# Patient Record
Sex: Female | Born: 1995 | Race: Black or African American | Hispanic: No | Marital: Single | Smoking: Current some day smoker
Health system: Southern US, Community
[De-identification: ages and names within clinical notes are randomized; demographics above are authoritative.]

---
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
## 2015-11-07 ENCOUNTER — Encounter (HOSPITAL_COMMUNITY): Payer: Self-pay | Admitting: *Deleted

## 2015-11-07 ENCOUNTER — Emergency Department (HOSPITAL_COMMUNITY)
Admission: EM | Admit: 2015-11-07 | Discharge: 2015-11-07 | Disposition: A | Discharge status: Home / Self Care | Payer: Medicaid Other | Attending: Emergency Medicine | Admitting: Emergency Medicine

## 2015-11-07 DIAGNOSIS — B9789 Other viral agents as the cause of diseases classified elsewhere: Secondary | ICD-10-CM

## 2015-11-07 DIAGNOSIS — F17210 Nicotine dependence, cigarettes, uncomplicated: Secondary | ICD-10-CM | POA: Insufficient documentation

## 2015-11-07 DIAGNOSIS — R630 Anorexia: Secondary | ICD-10-CM | POA: Insufficient documentation

## 2015-11-07 DIAGNOSIS — J069 Acute upper respiratory infection, unspecified: Secondary | ICD-10-CM | POA: Insufficient documentation

## 2015-11-07 MED ORDER — ACETAMINOPHEN 500 MG PO TABS
1000.0000 mg | ORAL_TABLET | Freq: Once | ORAL | Status: AC
  Administered 2015-11-07: 1000 mg via ORAL
  Filled 2015-11-07: qty 2

## 2015-11-07 MED ORDER — ALBUTEROL SULFATE HFA 108 (90 BASE) MCG/ACT IN AERS
2.0000 | INHALATION_SPRAY | Freq: Once | RESPIRATORY_TRACT | Status: AC
  Administered 2015-11-07: 2 via RESPIRATORY_TRACT
  Filled 2015-11-07: qty 6.7

## 2015-11-07 NOTE — Discharge Instructions (Signed)
Upper Respiratory Infection, Adult Most upper respiratory infections (URIs) are a viral infection of the air passages leading to the lungs. A URI affects the nose, throat, and upper air passages. The most common type of URI is nasopharyngitis and is typically referred to as "the common cold." URIs run their course and usually go away on their own. Most of the time, a URI does not require medical attention, but sometimes a bacterial infection in the upper airways can follow a viral infection. This is called a secondary infection. Sinus and middle ear infections are common types of secondary upper respiratory infections. Bacterial pneumonia can also complicate a URI. A URI can worsen asthma and chronic obstructive pulmonary disease (COPD). Sometimes, these complications can require emergency medical care and may be life threatening.  CAUSES Almost all URIs are caused by viruses. A virus is a type of germ and can spread from one person to another.  RISKS FACTORS You may be at risk for a URI if:   You smoke.   You have chronic heart or lung disease.  You have a weakened defense (immune) system.   You are very young or very old.   You have nasal allergies or asthma.  You work in crowded or poorly ventilated areas.  You work in health care facilities or schools. SIGNS AND SYMPTOMS  Symptoms typically develop 2-3 days after you come in contact with a cold virus. Most viral URIs last 7-10 days. However, viral URIs from the influenza virus (flu virus) can last 14-18 days and are typically more severe. Symptoms may include:   Runny or stuffy (congested) nose.   Sneezing.   Cough.   Sore throat.   Headache.   Fatigue.   Fever.   Loss of appetite.   Pain in your forehead, behind your eyes, and over your cheekbones (sinus pain).  Muscle aches.  DIAGNOSIS  Your health care provider may diagnose a URI by:  Physical exam.  Tests to check that your symptoms are not due to  another condition such as:  Strep throat.  Sinusitis.  Pneumonia.  Asthma. TREATMENT  A URI goes away on its own with time. It cannot be cured with medicines, but medicines may be prescribed or recommended to relieve symptoms. Medicines may help:  Reduce your fever.  Reduce your cough.  Relieve nasal congestion. HOME CARE INSTRUCTIONS   Take medicines only as directed by your health care provider.   Gargle warm saltwater or take cough drops to comfort your throat as directed by your health care provider.  Use a warm mist humidifier or inhale steam from a shower to increase air moisture. This may make it easier to breathe.  Drink enough fluid to keep your urine clear or pale yellow.   Eat soups and other clear broths and maintain good nutrition.   Rest as needed.   Return to work when your temperature has returned to normal or as your health care provider advises. You may need to stay home longer to avoid infecting others. You can also use a face mask and careful hand washing to prevent spread of the virus.  Increase the usage of your inhaler if you have asthma.   Do not use any tobacco products, including cigarettes, chewing tobacco, or electronic cigarettes. If you need help quitting, ask your health care provider. PREVENTION  The best way to protect yourself from getting a cold is to practice good hygiene.   Avoid oral or hand contact with people with cold   symptoms.   Wash your hands often if contact occurs.  There is no clear evidence that vitamin C, vitamin E, echinacea, or exercise reduces the chance of developing a cold. However, it is always recommended to get plenty of rest, exercise, and practice good nutrition.  SEEK MEDICAL CARE IF:   You are getting worse rather than better.   Your symptoms are not controlled by medicine.   You have chills.  You have worsening shortness of breath.  You have brown or red mucus.  You have yellow or brown nasal  discharge.  You have pain in your face, especially when you bend forward.  You have a fever.  You have swollen neck glands.  You have pain while swallowing.  You have white areas in the back of your throat. SEEK IMMEDIATE MEDICAL CARE IF:   You have severe or persistent:  Headache.  Ear pain.  Sinus pain.  Chest pain.  You have chronic lung disease and any of the following:  Wheezing.  Prolonged cough.  Coughing up blood.  A change in your usual mucus.  You have a stiff neck.  You have changes in your:  Vision.  Hearing.  Thinking.  Mood. MAKE SURE YOU:   Understand these instructions.  Will watch your condition.  Will get help right away if you are not doing well or get worse.   This information is not intended to replace advice given to you by your health care provider. Make sure you discuss any questions you have with your health care provider.   Document Released: 01/23/2001 Document Revised: 12/14/2014 Document Reviewed: 11/04/2013 Elsevier Interactive Patient Education 2016 [employer]  

## 2015-11-07 NOTE — ED Notes (Signed)
Pt reports having cough, headache, bodyaches for several days. Had episodes of diarrhea, no vomiting. Mask on pt at triage.

## 2015-11-07 NOTE — ED Provider Notes (Signed)
CSN: [id number]     Arrival date & time 11/07/15  0857 History   First MD Initiated Contact with Patient 11/07/15 0912     Chief Complaint  Patient presents with  . Influenza     (Consider location/radiation/quality/duration/timing/severity/associated sxs/prior Treatment) Patient is a 20 y.o. female presenting with flu symptoms. The history is provided by the patient.  Influenza Presenting symptoms: cough and myalgias   Presenting symptoms: no vomiting   Severity:  Moderate Onset quality:  Gradual Duration:  4 days Progression:  Worsening Chronicity:  New Relieved by:  Nothing Worsened by:  Nothing tried Ineffective treatments: theraflu. Associated symptoms: decreased appetite and nasal congestion     History reviewed. No pertinent past medical history. History reviewed. No pertinent past surgical history. History reviewed. No pertinent family history. Social History  Substance Use Topics  . Smoking status: Current Every Day Smoker    Types: Cigarettes  . Smokeless tobacco: None  . Alcohol Use: No   OB History    No data available     Review of Systems  Constitutional: Positive for decreased appetite.  HENT: Positive for congestion.   Respiratory: Positive for cough.   Gastrointestinal: Negative for vomiting.  Musculoskeletal: Positive for myalgias.  All other systems reviewed and are negative.     Allergies  Review of patient's allergies indicates no known allergies.  Home Medications   Prior to Admission medications   Medication Sig Start Date End Date Taking? Authorizing Provider  Phenylephrine-Pheniramine-DM [facility](THERAFLU COLD & COUGH) 06-02-19 MG PACK Take 1 packet by mouth every 8 (eight) hours as needed (flu like symptoms).   Yes Historical Provider, MD   BP 117/81 mmHg  Pulse 107  Temp(Src) 98.6 F (37 C) (Oral)  Resp 16  SpO2 96%  LMP 10/31/2015 Physical Exam  Constitutional: She is oriented to person, place, and time. She appears well-developed  and well-nourished. No distress.  HENT:  Head: Normocephalic.  Eyes: Conjunctivae are normal.  Neck: Neck supple. No tracheal deviation present.  Cardiovascular: Normal rate, regular rhythm and normal heart sounds.   Pulmonary/Chest: Effort normal. No respiratory distress. She has wheezes (faint end expiratory). She has no rales. She exhibits no tenderness.  Abdominal: Soft. She exhibits no distension. There is no tenderness.  Neurological: She is alert and oriented to person, place, and time.  Skin: Skin is warm and dry. No rash noted.  Psychiatric: She has a normal mood and affect.  Vitals reviewed.   ED Course  Procedures (including critical care time) Labs Review Labs Reviewed - No data to display  Imaging Review No results found. I have personally reviewed and evaluated these images and lab results as part of my medical decision-making.   EKG Interpretation None      MDM   Final diagnoses:  Viral URI with cough    20 y.o. female presents with cough, headache, myalgias over the last 4 days that has been improving. No signs of respiratory distress, non-toxic appearing, no concern for pneumonia with this clinical picture. No emergent testing indicated at this time. Pt discharged with likely viral cough which will be self limited in its course. Advised on optimal use of motrin and tylenol for fever or symptomatic control. Given albuterol for home with faint wheeze likely from mild reactive airway and smoking history,     Lyndal Pulleyaniel Aleicia Kenagy, MD 11/07/15 1019

## 2016-05-14 ENCOUNTER — Emergency Department (HOSPITAL_COMMUNITY): Admission: EM | Admit: 2016-05-14 | Discharge: 2016-05-14 | Discharge status: Against Medical Advice | Payer: Self-pay

## 2016-05-14 NOTE — ED Notes (Signed)
Called to triage. Unable to locate at this time. 

## 2016-05-14 NOTE — ED Notes (Signed)
Pt called for In waiting area for triage. No answer X3

## 2016-05-15 ENCOUNTER — Encounter (HOSPITAL_COMMUNITY): Payer: Self-pay | Admitting: Emergency Medicine

## 2016-05-15 ENCOUNTER — Emergency Department (HOSPITAL_COMMUNITY)
Admission: EM | Admit: 2016-05-15 | Discharge: 2016-05-15 | Disposition: A | Discharge status: Home / Self Care | Payer: Self-pay | Attending: Emergency Medicine | Admitting: Emergency Medicine

## 2016-05-15 DIAGNOSIS — R51 Headache: Secondary | ICD-10-CM | POA: Insufficient documentation

## 2016-05-15 DIAGNOSIS — R519 Headache, unspecified: Secondary | ICD-10-CM

## 2016-05-15 DIAGNOSIS — J029 Acute pharyngitis, unspecified: Secondary | ICD-10-CM | POA: Insufficient documentation

## 2016-05-15 DIAGNOSIS — F17210 Nicotine dependence, cigarettes, uncomplicated: Secondary | ICD-10-CM | POA: Insufficient documentation

## 2016-05-15 LAB — RAPID STREP SCREEN (MED CTR MEBANE ONLY): STREPTOCOCCUS, GROUP A SCREEN (DIRECT): NEGATIVE

## 2016-05-15 MED ORDER — DIPHENHYDRAMINE HCL 25 MG PO CAPS
25.0000 mg | ORAL_CAPSULE | Freq: Once | ORAL | Status: AC
  Administered 2016-05-15: 25 mg via ORAL
  Filled 2016-05-15: qty 1

## 2016-05-15 MED ORDER — KETOROLAC TROMETHAMINE 60 MG/2ML IM SOLN
60.0000 mg | Freq: Once | INTRAMUSCULAR | Status: AC
  Administered 2016-05-15: 60 mg via INTRAMUSCULAR
  Filled 2016-05-15: qty 2

## 2016-05-15 MED ORDER — ONDANSETRON 4 MG PO TBDP
4.0000 mg | ORAL_TABLET | Freq: Once | ORAL | Status: AC
  Administered 2016-05-15: 4 mg via ORAL
  Filled 2016-05-15: qty 1

## 2016-05-15 NOTE — ED Notes (Signed)
C/O LEFT HEADACHE AND SORE THROAT X 2 DAYS. STATES HER BROTHER IS BEING TX FOR STREP.

## 2016-05-15 NOTE — ED Provider Notes (Signed)
[facility]-EMERGENCY DEPT Provider Note   CSN: [id number] Arrival date & time: 05/15/16  1206  By signing my name below, I, Christy SartoriusAnastasia Kolousek, attest that this documentation has been prepared under the direction and in the presence of  Arvilla MeresAshley Meyer, PA-C. Electronically Signed: Christy SartoriusAnastasia Kolousek, ED Scribe. 05/15/16. 1:46 PM.  History   Chief Complaint Chief Complaint  Patient presents with  . Generalized Body Aches  . Sore Throat   The history is provided by the patient and medical records. No language interpreter was used.     HPI Comments:  Taylor Reeves is a 20 y.o. female with a history of bronchitis who presents to the Emergency Department complaining of a waxing and waning headache, sore throat, and body aches. She notes her sore throat and body aches started 2 days. Her headache began yesterday morning.  Her pain is an 8/10 on waking and she states her pain is worse in the mornings.  She describes the pain as left sided, sharp and throbbing. She reports associated light sensitivity, nausea, and cough.  Her brother was diagnosed with strep 2 days ago.  She was in the ED yesterday but left due to wait time.  She tried tylenol with some relief, her last dose was early this morning.  She denies changes to vision, vomiting, neck pain, neck stiffness, fever, abnormal gait, facial droop, slurred speech, congestion, post nasal drip, eye discharge, chest congestion, chest pain, shortness of breath, dizziness, lightheadedness and wheezing. No trauma.   History reviewed. No pertinent past medical history.  There are no active problems to display for this patient.   History reviewed. No pertinent surgical history.  OB History    No data available       Home Medications    Prior to Admission medications   Medication Sig Start Date End Date Taking? Authorizing Provider  Phenylephrine-Pheniramine-DM [facility](THERAFLU COLD & COUGH) 06-02-19 MG PACK Take 1 packet by mouth every 8 (eight) hours as  needed (flu like symptoms).    Historical Provider, MD    Family History History reviewed. No pertinent family history.  Social History Social History  Substance Use Topics  . Smoking status: Current Every Day Smoker    Packs/day: 0.50    Types: Cigars  . Smokeless tobacco: Not on file  . Alcohol use No     Allergies   Review of patient's allergies indicates no known allergies.   Review of Systems Review of Systems  Constitutional: Negative for fever.  HENT: Positive for sore throat. Negative for congestion, ear pain, postnasal drip, rhinorrhea, sinus pressure and trouble swallowing.   Eyes: Positive for photophobia. Negative for discharge and visual disturbance.  Respiratory: Positive for cough and chest tightness. Negative for shortness of breath and wheezing.   Cardiovascular: Negative for chest pain.  Gastrointestinal: Positive for nausea. Negative for abdominal pain and vomiting.  Musculoskeletal: Positive for myalgias.  Neurological: Positive for headaches. Negative for dizziness, syncope, facial asymmetry, speech difficulty, weakness, light-headedness and numbness.     Physical Exam Updated Vital Signs BP 101/60 (BP Location: Right Arm)   Pulse 70   Temp 98 F (36.7 C) (Oral)   Resp 18   Ht 4\' 11"  (1.499 m)   Wt 75.8 kg   LMP 05/07/2016   SpO2 100%   BMI 33.73 kg/m   Physical Exam  Constitutional: She appears well-developed and well-nourished. No distress.  HENT:  Head: Normocephalic and atraumatic.  Mouth/Throat: Uvula is midline, oropharynx is clear and moist and mucous membranes  are normal. No trismus in the jaw. No uvula swelling. No oropharyngeal exudate. No tonsillar exudate.  No trismus. Uvula midline. No posterior oropharynx swelling or erythema. Managing oral secretions.   Eyes: Conjunctivae and EOM are normal. Pupils are equal, round, and reactive to light. Right eye exhibits no discharge. Left eye exhibits no discharge. No scleral icterus.    Neck: Normal range of motion and phonation normal. Neck supple. No neck rigidity. Normal range of motion present.  No nuchal rigidity.   Cardiovascular: Normal rate, regular rhythm, normal heart sounds and intact distal pulses.   No murmur heard. Pulmonary/Chest: Effort normal and breath sounds normal. No stridor. No respiratory distress. She has no wheezes. She has no rales.   No hypoxia.   Abdominal: Soft. Bowel sounds are normal. She exhibits no distension. There is no tenderness. There is no rigidity, no rebound, no guarding and no CVA tenderness.  Musculoskeletal: Normal range of motion.  Lymphadenopathy:    She has no cervical adenopathy.  Neurological: She is alert. She has normal reflexes. She is not disoriented. She displays normal reflexes. Coordination and gait normal. GCS eye subscore is 4. GCS verbal subscore is 5. GCS motor subscore is 6.  Mental Status:  Alert, thought content appropriate, able to give a coherent history. Speech fluent without evidence of aphasia. Able to follow 2 step commands without difficulty.  Cranial Nerves:  II:  Peripheral visual fields grossly normal, pupils equal, round, reactive to light III,IV, VI: ptosis not present, extra-ocular motions intact bilaterally  V,VII: smile symmetric, facial light touch sensation equal VIII: hearing grossly normal to voice  X: uvula elevates symmetrically  XI: bilateral shoulder shrug symmetric and strong XII: midline tongue extension without fassiculations Motor:  Normal tone. 5/5 in upper and lower extremities bilaterally including strong and equal grip strength and dorsiflexion/plantar flexion Sensory: light touch normal in all extremities. DTRs: biceps and patellar 2+ symmetric b/l Cerebellar: normal finger-to-nose with bilateral upper extremities Gait: normal gait and balance CV: distal pulses palpable throughout   Skin: Skin is warm and dry. She is not diaphoretic.  Psychiatric: She has a normal mood and  affect. Her behavior is normal.  Nursing note and vitals reviewed.    ED Treatments / Results   DIAGNOSTIC STUDIES:  Oxygen Saturation is 97% on RA, NML by my interpretation.    COORDINATION OF CARE:  1:46 PM Discussed treatment plan with pt at bedside and pt agreed to plan.  Labs (all labs ordered are listed, but only abnormal results are displayed) Labs Reviewed  RAPID STREP SCREEN (NOT AT [facility])  CULTURE, GROUP A STREP [facility])    EKG  EKG Interpretation None       Radiology No results found.  Procedures Procedures (including critical care time)  Medications Ordered in ED Medications  diphenhydrAMINE (BENADRYL) capsule 25 mg (25 mg Oral Given 05/15/16 1353)  ketorolac (TORADOL) injection 60 mg (60 mg Intramuscular Given 05/15/16 1354)  ondansetron (ZOFRAN-ODT) disintegrating tablet 4 mg (4 mg Oral Given 05/15/16 1353)     Initial Impression / Assessment and Plan / ED Course  I have reviewed the triage vital signs and the nursing notes.  Pertinent labs & imaging results that were available during my care of the patient were reviewed by me and considered in my medical decision making (see chart for details).  Clinical Course    Patient presents to ED with complaint of sore throat, headache, and generalized body aches. Patient is afebrile and non-toxic appearing in NAD.  VSS. Physical exam is re-assuring. No trismus. Uvula is midline. No nuchal rigidity. Managing oral secretions. Normal neurologic exam. Low suspicion for meningitis, ICH, or SAH. Rapid strep negative. No evidence of PTA or deep tissue infection. Sxs resolved following treatment in ED. Suspect ?viral pharyngitis vs. ?migraine headache. Discussed results and plan with patient. Discussed symptomatic management. Follow up with PCP for re-evaluation, contact information for [facility] provided. Return precautions discussed. Patient voiced understanding and is agreeable.    Final  Clinical Impressions(s) / ED Diagnoses   Final diagnoses:  Sore throat  Nonintractable headache, unspecified chronicity pattern, unspecified headache type    New Prescriptions Discharge Medication List as of 05/15/2016  3:13 PM     I personally performed the services described in this documentation, which was scribed in my presence. The recorded information has been reviewed and is accurate.     Lona Kettle, [place] 05/17/16 2152    Gwyneth Sprout, MD 05/18/16 [phone]

## 2016-05-15 NOTE — Discharge Instructions (Signed)
Read the information below.  Your rapid strep was negative, it will be cultured, if abnormal you will be notified. You were given medicine for your headache with relief.  I encourage you to drink plenty of fluids and stay well hydrated.  You can take tylenol 650mg  every 6hrs or motrin 400mg  every 6hrs for pain relief.  For your sore throat you can do over the counter cepacol drops for relief.  I have provided the contact information for [facility], please call to establish a primary provider.  I have also provided the contact information for [facility] for you for further evaluation of headaches.  You may return to the Emergency Department at any time for worsening condition or any new symptoms that concern you. Return to ED if you develop severe headache, changes in vision, fever, neck stiffness, numbness, weakness or any other new/concerning symptoms.

## 2016-05-15 NOTE — ED Notes (Signed)
[facility] 65 COUPON GIVEN

## 2016-05-15 NOTE — ED Triage Notes (Addendum)
Pt reports was here yesterday but left due to wait time. States sore throat and body aches and headache. Denies fevers. Mild cough. Brother dx with strep 2 days ago.

## 2016-05-17 LAB — CULTURE, GROUP A STREP (THRC)

## 2016-06-04 ENCOUNTER — Emergency Department (HOSPITAL_COMMUNITY)
Admission: EM | Admit: 2016-06-04 | Discharge: 2016-06-05 | Disposition: A | Discharge status: Home / Self Care | Payer: Self-pay | Attending: Emergency Medicine | Admitting: Emergency Medicine

## 2016-06-04 ENCOUNTER — Encounter (HOSPITAL_COMMUNITY): Payer: Self-pay | Admitting: Emergency Medicine

## 2016-06-04 DIAGNOSIS — K2970 Gastritis, unspecified, without bleeding: Secondary | ICD-10-CM | POA: Insufficient documentation

## 2016-06-04 DIAGNOSIS — F17210 Nicotine dependence, cigarettes, uncomplicated: Secondary | ICD-10-CM | POA: Insufficient documentation

## 2016-06-04 LAB — URINALYSIS, ROUTINE W REFLEX MICROSCOPIC
BILIRUBIN URINE: NEGATIVE
Glucose, UA: NEGATIVE mg/dL
HGB URINE DIPSTICK: NEGATIVE
Ketones, ur: NEGATIVE mg/dL
Leukocytes, UA: NEGATIVE
Nitrite: NEGATIVE
Protein, ur: NEGATIVE mg/dL
SPECIFIC GRAVITY, URINE: 1.007 (ref 1.005–1.030)
pH: 6 (ref 5.0–8.0)

## 2016-06-04 LAB — POC URINE PREG, ED: PREG TEST UR: NEGATIVE

## 2016-06-04 NOTE — ED Notes (Signed)
Pt comes in w/ blood in emesis this morning around 0300. Emesis occurrence x1 pt denies nausea currently. Pt does report drinking and is unsure if any correlation. Denies abdominal pain and diarrhea. Bowel sounds active x4 quadrants. Ptreports decrease in appetite. NAD noted. PT AOx4.

## 2016-06-04 NOTE — ED Triage Notes (Signed)
Pt states she has had a decreased appetite lately and last night started having vomiting  Pt states this morning she had some blood in her emesis  Pt states she did drink alcohol last night  Denies abd pain or diarrhea

## 2016-06-04 NOTE — ED Provider Notes (Signed)
[facility]-EMERGENCY DEPT Provider Note   CSN: [id number] Arrival date & time: 06/04/16  2148  By signing my name below, I, Taylor Reeves, attest that this documentation has been prepared under the direction and in the presence of Audry Pili, PA-C. Electronically Signed: Alyssa Reeves, ED Scribe. 06/04/16. 11:00 PM.  History   Chief Complaint Chief Complaint  Patient presents with  . Emesis   The history is provided by the patient. No language interpreter was used.   HPI Comments: Taylor Reeves is a 20 y.o. female who presents to the Emergency Department complaining of gradual onset, episodic vomiting onset yesterday. Pt reports associated blood in vomit x 1 with dry heaving prior, decreased appetite. She states there was not much blood in her vomit. None since then. Pt has been having a decreased appetite for a few months now. She did consume alcohol last night, but is unaware of how much she consumed. Pt frequently smokes marijuana and "pops pills". Denies pertinent medical hx. She denies abdominal pain, chest pain, fever and diarrhea. No other symptoms noted.   History reviewed. No pertinent past medical history.  There are no active problems to display for this patient.   History reviewed. No pertinent surgical history.  OB History    No data available     Home Medications    Prior to Admission medications   Medication Sig Start Date End Date Taking? Authorizing Provider  Phenylephrine-Pheniramine-DM [facility] COLD & COUGH) 06-02-19 MG PACK Take 1 packet by mouth every 8 (eight) hours as needed (flu like symptoms).    Historical Provider, MD   Family History Family History  Problem Relation Age of Onset  . Family history unknown: Yes    Social History Social History  Substance Use Topics  . Smoking status: Current Every Day Smoker    Packs/day: 0.50    Types: Cigars, Cigarettes  . Smokeless tobacco: Never Used  . Alcohol use Yes    Allergies   Review of patient's  allergies indicates no known allergies.  Review of Systems Review of Systems A complete 10 system review of systems was obtained and all systems are negative except as noted in the HPI and PMH.    Physical Exam Updated Vital Signs BP 118/88 (BP Location: Left Arm)   Pulse 88   Temp 97.7 F (36.5 C) (Oral)   Resp 18   Ht 4\' 11"  (1.499 m)   Wt 167 lb (75.8 kg)   LMP 05/27/2016 (Approximate)   SpO2 99%   BMI 33.73 kg/m   Physical Exam  Constitutional: She is oriented to person, place, and time. Vital signs are normal. She appears well-developed and well-nourished. She is active. No distress.  HENT:  Head: Normocephalic and atraumatic.  Right Ear: Hearing normal.  Left Ear: Hearing normal.  Eyes: Conjunctivae and EOM are normal. Pupils are equal, round, and reactive to light.  Neck: Normal range of motion. Neck supple.  Cardiovascular: Normal rate, regular rhythm, normal heart sounds and intact distal pulses.   Pulmonary/Chest: Effort normal and breath sounds normal. No respiratory distress. She has no wheezes. She has no rales.  Abdominal: Soft. Bowel sounds are normal. There is no tenderness. There is no rigidity, no rebound, no guarding, no CVA tenderness, no tenderness at McBurney's point and negative Murphy's sign.  Musculoskeletal: Normal range of motion.  Neurological: She is alert and oriented to person, place, and time.  Skin: Skin is warm and dry.  Psychiatric: She has a normal mood and affect. Her  speech is normal and behavior is normal. Thought content normal.  Nursing note and vitals reviewed.  ED Treatments / Results  DIAGNOSTIC STUDIES: Oxygen Saturation is 99% on RA, normal by my interpretation.    COORDINATION OF CARE: 10:57 PM Discussed treatment plan with pt at bedside which includes lab work and pt agreed to plan.  Labs (all labs ordered are listed, but only abnormal results are displayed) Labs Reviewed  COMPREHENSIVE METABOLIC PANEL - Abnormal; Notable  for the following:       Result Value   Potassium 3.4 (*)    Total Protein 8.4 (*)    Total Bilirubin 0.2 (*)    All other components within normal limits  CBC  URINALYSIS, ROUTINE W REFLEX MICROSCOPIC (NOT [facility])  LIPASE, BLOOD  POC URINE PREG, ED   EKG  EKG Interpretation None       Radiology No results found.  Procedures Procedures (including critical care time)  Medications Ordered in ED Medications - No data to display   Initial Impression / Assessment and Plan / ED Course  I have reviewed the triage vital signs and the nursing notes.  Pertinent labs & imaging results that were available during my care of the patient were reviewed by me and considered in my medical decision making (see chart for details).  Clinical Course   Final Clinical Impressions(s) / ED Diagnoses   I have reviewed and evaluated the relevant laboratory values I have reviewed the relevant previous healthcare records. I obtained HPI from historian.  ED Course:  Assessment: Patient is a 20yF presents with emesis with blood tinge sputum yesterday. Non currently. No emesis today. Notes decrease appetite for "a while." No hx varices or cirrhosis. On exam, nontoxic, nonseptic appearing, in no apparent distress. Labs and vitals reviewed.  Patient does not meet the SIRS or Sepsis criteria.  Able to tolerate PO. On repeat exam patient does not have a surgical abdomen and there are no peritoneal signs.  No indication of appendicitis, bowel obstruction, bowel perforation, cholecystitis, diverticulitis, PID or ectopic pregnancy. Likely mallory weiss tear that has resolved. Given Rx Protonixfor possible gastritis. Patient discharged home with symptomatic treatment and given strict instructions for follow-up with their primary care physician.  I have also discussed reasons to return immediately to the ER.  Patient expresses understanding and agrees with plan.  Disposition/Plan:  DC Home Additional Verbal  discharge instructions given and discussed with patient.  Pt Instructed to f/u with PCP in the next week for evaluation and treatment of symptoms. Return precautions given Pt acknowledges and agrees with plan  Supervising Physician Jacalyn LefevreJulie Haviland, MD  I personally performed the services described in this documentation, which was scribed in my presence. The recorded information has been reviewed and is accurate.   Final diagnoses:  Gastritis without bleeding, unspecified chronicity, unspecified gastritis type    New Prescriptions New Prescriptions   No medications on file     Audry Piliyler Raekwan Spelman, PA-C 06/05/16 0054    Jacalyn LefevreJulie Haviland, MD 06/11/16 [phone]

## 2016-06-05 LAB — COMPREHENSIVE METABOLIC PANEL
ALBUMIN: 4.8 g/dL (ref 3.5–5.0)
ALT: 17 U/L (ref 14–54)
AST: 23 U/L (ref 15–41)
Alkaline Phosphatase: 68 U/L (ref 38–126)
Anion gap: 10 (ref 5–15)
BUN: 10 mg/dL (ref 6–20)
CHLORIDE: 104 mmol/L (ref 101–111)
CO2: 25 mmol/L (ref 22–32)
CREATININE: 0.85 mg/dL (ref 0.44–1.00)
Calcium: 9.9 mg/dL (ref 8.9–10.3)
GFR calc Af Amer: >60 mL/min (ref >60)
GLUCOSE: 98 mg/dL (ref 65–99)
POTASSIUM: 3.4 mmol/L — AB (ref 3.5–5.1)
SODIUM: 139 mmol/L (ref 135–145)
Total Bilirubin: 0.2 mg/dL — ABNORMAL LOW (ref 0.3–1.2)
Total Protein: 8.4 g/dL — ABNORMAL HIGH (ref 6.5–8.1)

## 2016-06-05 LAB — CBC
HCT: 42.9 % (ref 36.0–46.0)
Hemoglobin: 14.9 g/dL (ref 12.0–15.0)
MCH: 31.2 pg (ref 26.0–34.0)
MCHC: 34.7 g/dL (ref 30.0–36.0)
MCV: 89.9 fL (ref 78.0–100.0)
PLATELETS: 283 10*3/uL (ref 150–400)
RBC: 4.77 MIL/uL (ref 3.87–5.11)
RDW: 13.3 % (ref 11.5–15.5)
WBC: 9.4 10*3/uL (ref 4.0–10.5)

## 2016-06-05 LAB — LIPASE, BLOOD: LIPASE: 22 U/L (ref 11–51)

## 2016-06-05 MED ORDER — PANTOPRAZOLE SODIUM 20 MG PO TBEC: 20.0000 mg | DELAYED_RELEASE_TABLET | Freq: Every day | ORAL | 0 refills | Status: DC

## 2016-06-05 NOTE — Discharge Instructions (Signed)
Please read and follow all provided instructions.  Your diagnoses today include:  1. Gastritis without bleeding, unspecified chronicity, unspecified gastritis type     Tests performed today include: Blood counts and electrolytes Blood tests to check liver and kidney function Blood tests to check pancreas function Urine test to look for infection and pregnancy (in women) Vital signs. See below for your results today.   Medications prescribed:   Take any prescribed medications only as directed.  Home care instructions:  Follow any educational materials contained in this packet.  Follow-up instructions: Please follow-up with your primary care provider in the next 2 days for further evaluation of your symptoms.    Return instructions:  SEEK IMMEDIATE MEDICAL ATTENTION IF: The pain does not go away or becomes severe  A temperature above 101F develops  Repeated vomiting occurs (multiple episodes)  The pain becomes localized to portions of the abdomen. The right side could possibly be appendicitis. In an adult, the left lower portion of the abdomen could be colitis or diverticulitis.  Blood is being passed in stools or vomit (bright red or black tarry stools)  You develop chest pain, difficulty breathing, dizziness or fainting, or become confused, poorly responsive, or inconsolable (young children) If you have any other emergent concerns regarding your health  Additional Information: Abdominal (belly) pain can be caused by many things. Your caregiver performed an examination and possibly ordered blood/urine tests and imaging (CT scan, x-rays, ultrasound). Many cases can be observed and treated at home after initial evaluation in the emergency department. Even though you are being discharged home, abdominal pain can be unpredictable. Therefore, you need a repeated exam if your pain does not resolve, returns, or worsens. Most patients with abdominal pain don't have to be admitted to the  hospital or have surgery, but serious problems like appendicitis and gallbladder attacks can start out as nonspecific pain. Many abdominal conditions cannot be diagnosed in one visit, so follow-up evaluations are very important.  Your vital signs today were: BP 118/88 (BP Location: Left Arm)    Pulse 88    Temp 97.7 F (36.5 C) (Oral)    Resp 18    Ht 4\' 11"  (1.499 m)    Wt 75.8 kg    LMP 05/27/2016 (Approximate)    SpO2 99%    BMI 33.73 kg/m  If your blood pressure (bp) was elevated above 135/85 this visit, please have this repeated by your doctor within one month. --------------

## 2016-06-05 NOTE — ED Notes (Signed)
Pt passed fluid challenge. 

## 2016-09-19 ENCOUNTER — Emergency Department (HOSPITAL_COMMUNITY)
Admission: EM | Admit: 2016-09-19 | Discharge: 2016-09-19 | Disposition: A | Discharge status: Home / Self Care | Payer: Medicaid Other | Attending: Emergency Medicine | Admitting: Emergency Medicine

## 2016-09-19 ENCOUNTER — Encounter (HOSPITAL_COMMUNITY): Payer: Self-pay | Admitting: *Deleted

## 2016-09-19 DIAGNOSIS — J020 Streptococcal pharyngitis: Secondary | ICD-10-CM | POA: Insufficient documentation

## 2016-09-19 DIAGNOSIS — F17210 Nicotine dependence, cigarettes, uncomplicated: Secondary | ICD-10-CM | POA: Insufficient documentation

## 2016-09-19 DIAGNOSIS — F17290 Nicotine dependence, other tobacco product, uncomplicated: Secondary | ICD-10-CM | POA: Insufficient documentation

## 2016-09-19 LAB — RAPID STREP SCREEN (MED CTR MEBANE ONLY): STREPTOCOCCUS, GROUP A SCREEN (DIRECT): POSITIVE — AB

## 2016-09-19 MED ORDER — KETOROLAC TROMETHAMINE 30 MG/ML IJ SOLN
60.0000 mg | Freq: Once | INTRAMUSCULAR | Status: AC
  Administered 2016-09-19: 60 mg via INTRAMUSCULAR
  Filled 2016-09-19: qty 2

## 2016-09-19 MED ORDER — PENICILLIN G BENZATHINE 1200000 UNIT/2ML IM SUSP
1.2000 10*6.[IU] | Freq: Once | INTRAMUSCULAR | Status: AC
  Administered 2016-09-19: 1.2 10*6.[IU] via INTRAMUSCULAR
  Filled 2016-09-19: qty 2

## 2016-09-19 MED ORDER — KETOROLAC TROMETHAMINE 30 MG/ML IJ SOLN: 30.0000 mg | Freq: Once | INTRAMUSCULAR | Status: DC

## 2016-09-19 MED ORDER — DEXAMETHASONE SODIUM PHOSPHATE 10 MG/ML IJ SOLN
10.0000 mg | Freq: Once | INTRAMUSCULAR | Status: AC
  Administered 2016-09-19: 10 mg via INTRAMUSCULAR
  Filled 2016-09-19: qty 1

## 2016-09-19 MED ORDER — PENICILLIN G BENZATHINE & PROC 900000-300000 UNIT/2ML IM SUSP: 1.2000 10*6.[IU] | Freq: Once | INTRAMUSCULAR | Status: DC

## 2016-09-19 NOTE — ED Notes (Signed)
Papers reviewed wit patient and she verbalizes understanding. Medication administered and she reports decreased pain

## 2016-09-19 NOTE — Discharge Instructions (Signed)
Get help right away if: °You have new symptoms, such as vomiting, severe headache, stiff or painful neck, chest pain, or shortness of breath. °You have severe throat pain, drooling, or changes in your voice. °You have swelling of the neck, or the skin on the neck becomes red and tender. °You have signs of dehydration, such as fatigue, dry mouth, and decreased urination. °You become increasingly sleepy, or you cannot wake up completely. °Your joints become red or painful. °

## 2016-09-19 NOTE — ED Provider Notes (Signed)
[facility]-EMERGENCY DEPT Provider Note   CSN: [id number] Arrival date & time: 09/19/16  1159   By signing my name below, I, Freida Busman, attest that this documentation has been prepared under the direction and in the presence of Arthor Captain, PA-C. Electronically Signed: Freida Busman, Scribe. 09/19/2016. 3:15 PM.  History   Chief Complaint Chief Complaint  Patient presents with  . Sore Throat     The history is provided by the patient. No language interpreter was used.    HPI Comments:  Taylor Reeves is a 21 y.o. female who presents to the Emergency Department complaining of a sore throat that she woke up with yesterday AM.  No cough, fever, or difficulty swallowing. She denies h/o strep. No alleviating factors noted.   History reviewed. No pertinent past medical history.  There are no active problems to display for this patient.   History reviewed. No pertinent surgical history.  OB History    No data available       Home Medications    Prior to Admission medications   Medication Sig Start Date End Date Taking? Authorizing Provider  pantoprazole (PROTONIX) 20 MG tablet Take 1 tablet (20 mg total) by mouth daily. 06/05/16   Audry Pili, PA-C    Family History Family History  Problem Relation Age of Onset  . Family history unknown: Yes    Social History Social History  Substance Use Topics  . Smoking status: Current Every Day Smoker    Packs/day: 0.50    Types: Cigars, Cigarettes  . Smokeless tobacco: Never Used  . Alcohol use Yes     Comment: occassional     Allergies   Patient has no known allergies.   Review of Systems Review of Systems  Constitutional: Negative for fever.  HENT: Positive for sore throat. Negative for trouble swallowing.   Respiratory: Negative for cough.      Physical Exam Updated Vital Signs BP 121/73   Pulse 100   Temp 98.8 F (37.1 C) (Oral)   Resp 14   Ht 4\' 11"  (1.499 m)   Wt 72.6 kg   LMP 09/13/2016   SpO2 98%    BMI 32.32 kg/m   Physical Exam  Constitutional: She is oriented to person, place, and time. She appears well-developed and well-nourished. No distress.  HENT:  Head: Normocephalic.  Mouth/Throat: Tonsillar exudate.  Eyes: Conjunctivae are normal.  Neck: Normal range of motion. Neck supple.  Cardiovascular: Normal rate.   Pulmonary/Chest: Effort normal.  Abdominal: She exhibits no distension.  Musculoskeletal: Normal range of motion.  Neurological: She is alert and oriented to person, place, and time.  Skin: Skin is warm and dry.  Psychiatric: She has a normal mood and affect.  Nursing note and vitals reviewed.    ED Treatments / Results  DIAGNOSTIC STUDIES:  Oxygen Saturation is 98% on RA, normal by my interpretation.    COORDINATION OF CARE:  3:15 PM Discussed treatment plan with pt at bedside and pt agreed to plan.  Labs (all labs ordered are listed, but only abnormal results are displayed) Labs Reviewed  RAPID STREP SCREEN (NOT AT [facility]) - Abnormal; Notable for the following:       Result Value   Streptococcus, Group A Screen (Direct) POSITIVE (*)    All other components within normal limits    EKG  EKG Interpretation None       Radiology No results found.  Procedures Procedures (including critical care time)  Medications Ordered in ED Medications  dexamethasone (DECADRON) injection 10 mg (10 mg Intramuscular Given 09/19/16 1541)  ketorolac (TORADOL) 30 MG/ML injection 60 mg (60 mg Intramuscular Given 09/19/16 1540)  penicillin g benzathine (BICILLIN LA) 1200000 UNIT/2ML injection 1.2 Million Units (1.2 Million Units Intramuscular Given 09/19/16 1549)     Initial Impression / Assessment and Plan / ED Course  I have reviewed the triage vital signs and the nursing notes.  Pertinent labs & imaging results that were available during my care of the patient were reviewed by me and considered in my medical decision making (see chart for details).       Pt  rapid strep test positive. Pt is tolerating secretions. Presentation not concerning for peritonsillar abscess or spread of infection to deep spaces of the throat; patent airway. Pt will be given Bicillin and decadron  in the ED.  Specific return precautions discussed. Recommended PCP follow up. Pt appears safe for discharge.   Final Clinical Impressions(s) / ED Diagnoses   Final diagnoses:  Strep throat    New Prescriptions Discharge Medication List as of 09/19/2016  3:26 PM      I personally performed the services described in this documentation, which was scribed in my presence. The recorded information has been reviewed and is accurate.       Arthor Captainbigail Chaunta Bejarano, PA-C 09/23/16 2028    Shaune Pollackameron Isaacs, MD 09/24/16 [phone]

## 2016-09-19 NOTE — ED Triage Notes (Signed)
Pt c/o sore throat onset x 3-4 days, pt reports white mucus in the back of her throat, denies cough, fever & chills, A&O x4

## 2017-02-27 ENCOUNTER — Emergency Department (HOSPITAL_COMMUNITY)
Admission: EM | Admit: 2017-02-27 | Discharge: 2017-02-27 | Disposition: A | Discharge status: Home / Self Care | Payer: Medicaid Other

## 2017-02-27 NOTE — ED Notes (Signed)
Called pt for triage with no answer  

## 2017-02-27 NOTE — ED Notes (Signed)
Pt called x 2 by Uvaldo Bristleatjana.  [facility].

## 2017-11-09 ENCOUNTER — Emergency Department (HOSPITAL_COMMUNITY)
Admission: EM | Admit: 2017-11-09 | Discharge: 2017-11-09 | Disposition: A | Discharge status: Home / Self Care | Payer: Self-pay | Attending: Emergency Medicine | Admitting: Emergency Medicine

## 2017-11-09 ENCOUNTER — Encounter (HOSPITAL_COMMUNITY): Payer: Self-pay

## 2017-11-09 DIAGNOSIS — J020 Streptococcal pharyngitis: Secondary | ICD-10-CM | POA: Insufficient documentation

## 2017-11-09 DIAGNOSIS — Z79899 Other long term (current) drug therapy: Secondary | ICD-10-CM | POA: Insufficient documentation

## 2017-11-09 DIAGNOSIS — F17210 Nicotine dependence, cigarettes, uncomplicated: Secondary | ICD-10-CM | POA: Insufficient documentation

## 2017-11-09 LAB — RAPID STREP SCREEN (MED CTR MEBANE ONLY): Streptococcus, Group A Screen (Direct): POSITIVE — AB

## 2017-11-09 MED ORDER — AMOXICILLIN 500 MG PO CAPS
1000.0000 mg | ORAL_CAPSULE | Freq: Once | ORAL | Status: AC
  Administered 2017-11-09: 1000 mg via ORAL
  Filled 2017-11-09: qty 2

## 2017-11-09 MED ORDER — IBUPROFEN 400 MG PO TABS
600.0000 mg | ORAL_TABLET | Freq: Once | ORAL | Status: AC
  Administered 2017-11-09: 600 mg via ORAL
  Filled 2017-11-09: qty 1

## 2017-11-09 MED ORDER — AMOXICILLIN 500 MG PO CAPS: 1000.0000 mg | ORAL_CAPSULE | Freq: Two times a day (BID) | ORAL | 0 refills | Status: DC

## 2017-11-09 NOTE — ED Provider Notes (Signed)
[facility] EMERGENCY DEPARTMENT Provider Note   CSN: [id number] Arrival date & time: 11/09/17  1236     History   Chief Complaint Chief Complaint  Patient presents with  . Sore Throat    HPI Taylor Reeves is a 22 y.o. female.  22 year old female with no chronic medical conditions presents for evaluation of sore throat.  Initially developed sore throat yesterday morning.  Symptoms worsened last night and she had pain with swallowing.  Still present today.  No changes in speech.  No shortness of breath.  No fever.  No vomiting diarrhea or rash.  Took Robitussin prior to arrival without improvement in pain.  Also states she tried to use her toothbrush to scrape off her tonsils last night.  The history is provided by the patient.  Sore Throat     History reviewed. No pertinent past medical history.  There are no active problems to display for this patient.   History reviewed. No pertinent surgical history.   OB History   None      Home Medications    Prior to Admission medications   Medication Sig Start Date End Date Taking? Authorizing Provider  amoxicillin (AMOXIL) 500 MG capsule Take 2 capsules (1,000 mg total) by mouth 2 (two) times daily. For 10 days 11/09/17   Ree Shay, MD  pantoprazole (PROTONIX) 20 MG tablet Take 1 tablet (20 mg total) by mouth daily. 06/05/16   Audry Pili, PA-C    Family History Family History  Family history unknown: Yes    Social History Social History   Tobacco Use  . Smoking status: Current Every Day Smoker    Packs/day: 0.50    Types: Cigars, Cigarettes  . Smokeless tobacco: Never Used  Substance Use Topics  . Alcohol use: Yes    Comment: occassional  . Drug use: Yes    Types: Marijuana     Allergies   Patient has no known allergies.   Review of Systems Review of Systems All systems reviewed and were reviewed and were negative except as stated in the HPI   Physical Exam Updated Vital  Signs BP (!) 124/91 (BP Location: Right Arm)   Pulse 97   Temp 97.6 F (36.4 C) (Oral)   Ht 5\' 1"  (1.549 m)   Wt 77.1 kg (170 lb)   SpO2 100%   BMI 32.12 kg/m   Physical Exam  Constitutional: She is oriented to person, place, and time. She appears well-developed and well-nourished. No distress.  Well-appearing, sitting up in bed, normal speech, no trismus  HENT:  Head: Normocephalic and atraumatic.  Mouth/Throat: Oropharyngeal exudate present.  TMs normal bilaterally, throat erythematous with 3+ tonsils with bilateral exudates, uvula midline  Eyes: Pupils are equal, round, and reactive to light. Conjunctivae and EOM are normal.  Neck: Normal range of motion. Neck supple.  Cardiovascular: Normal rate, regular rhythm and normal heart sounds. Exam reveals no gallop and no friction rub.  No murmur heard. Pulmonary/Chest: Effort normal. No respiratory distress. She has no wheezes. She has no rales.  Abdominal: Soft. Bowel sounds are normal. There is no tenderness. There is no rebound and no guarding.  Musculoskeletal: Normal range of motion. She exhibits no tenderness.  Neurological: She is alert and oriented to person, place, and time. No cranial nerve deficit.  Normal strength 5/5 in upper and lower extremities, normal coordination  Skin: Skin is warm and dry. No rash noted.  Psychiatric: She has a normal mood and affect.  Nursing note and vitals reviewed.    ED Treatments / Results  Labs (all labs ordered are listed, but only abnormal results are displayed) Labs Reviewed  RAPID STREP SCREEN (NOT AT [facility]) - Abnormal; Notable for the following components:      Result Value   Streptococcus, Group A Screen (Direct) POSITIVE (*)    All other components within normal limits    EKG None  Radiology No results found.  Procedures Procedures (including critical care time)  Medications Ordered in ED Medications  amoxicillin (AMOXIL) capsule 1,000 mg (has no administration in  time range)  ibuprofen (ADVIL,MOTRIN) tablet 600 mg (has no administration in time range)     Initial Impression / Assessment and Plan / ED Course  I have reviewed the triage vital signs and the nursing notes.  Pertinent labs & imaging results that were available during my care of the patient were reviewed by me and considered in my medical decision making (see chart for details).    22 year old female with no chronic medical conditions presents with new onset sore throat since yesterday morning, worse today.  No associated fevers.  No respiratory symptoms and no vomiting or diarrhea.  On exam here afebrile with normal vitals and overall well-appearing and well-hydrated.  She does have an erythematous throat with 3+ tonsils with bilateral exudates.  No signs of peritonsillar abscess and no trismus.  Strep screen is positive.  Will treat with 10-day course of Amoxil, first dose here.  Ibuprofen as needed for sore throat.  PCP follow-up next week if symptoms persist or worsen with return precautions as outlined the discharge instructions.  Final Clinical Impressions(s) / ED Diagnoses   Final diagnoses:  Strep throat    ED Discharge Orders        Ordered    amoxicillin (AMOXIL) 500 MG capsule  2 times daily     11/09/17 1411       Ree Shayeis, Yolunda Kloos, MD 11/09/17 1413

## 2017-11-09 NOTE — Discharge Instructions (Signed)
Take 2 capsules of amoxicillin twice daily for 10 days to treat the strep throat infection. You are contagious for then next 24 hours (until you have had 2 doses of the antibiotic). Make sure to get a new toothbrush within the next 2days.  May take ibuprofen/Motrin 600 mg every 6 hours as needed for sore throat.  Also salt water gargle twice daily for the next 3 days.  Follow-up with your primary care provider if no improvement after 3 days of treatment or if symptoms worsen.  Return to ED sooner for inability to swallow, new breathing difficulty or new concerns.

## 2017-11-09 NOTE — ED Triage Notes (Signed)
Pt c/o sore throat and swollen tonsils. Tonsils seems red and swollen. Pt Denies sickness recently.

## 2017-12-11 ENCOUNTER — Encounter (HOSPITAL_COMMUNITY): Payer: Self-pay

## 2017-12-11 ENCOUNTER — Emergency Department (HOSPITAL_COMMUNITY)
Admission: EM | Admit: 2017-12-11 | Discharge: 2017-12-11 | Disposition: A | Discharge status: Home / Self Care | Payer: Self-pay | Attending: Emergency Medicine | Admitting: Emergency Medicine

## 2017-12-11 ENCOUNTER — Other Ambulatory Visit: Payer: Self-pay

## 2017-12-11 DIAGNOSIS — F17210 Nicotine dependence, cigarettes, uncomplicated: Secondary | ICD-10-CM | POA: Insufficient documentation

## 2017-12-11 DIAGNOSIS — J029 Acute pharyngitis, unspecified: Secondary | ICD-10-CM

## 2017-12-11 DIAGNOSIS — B349 Viral infection, unspecified: Secondary | ICD-10-CM | POA: Insufficient documentation

## 2017-12-11 LAB — GROUP A STREP BY PCR: Group A Strep by PCR: NOT DETECTED

## 2017-12-11 MED ORDER — LORATADINE 10 MG PO TABS: 10.0000 mg | ORAL_TABLET | Freq: Every day | ORAL | 0 refills | Status: DC

## 2017-12-11 NOTE — Discharge Instructions (Signed)
Please read attached information. If you experience any new or worsening signs or symptoms please return to the emergency room for evaluation. Please follow-up with your primary care provider or specialist as discussed. Please use medication prescribed only as directed and discontinue taking if you have any concerning signs or symptoms.   °

## 2017-12-11 NOTE — ED Provider Notes (Signed)
[facility] EMERGENCY DEPARTMENT Provider Note   CSN: [id number] Arrival date & time: 12/11/17  1404   History   Chief Complaint Chief Complaint  Patient presents with  . Sore Throat    HPI Taylor Reeves is a 22 y.o. female.  HPI   22 year old female presents today with plaints of sore throat.  Patient notes in addition of rhinorrhea nasal congestion over the last week.  Patient denies any fever, reports that she did lose her voice but this has returned.  She notes dry nonproductive cough, denies any chest pain or shortness of breath.  Patient notes history of strep pharyngitis in March of this year.  Mother sick with similar symptoms.  Patient reports history of allergies as a child but does not know if she has allergies now.   History reviewed. No pertinent past medical history.  There are no active problems to display for this patient.   History reviewed. No pertinent surgical history.   OB History   None      Home Medications    Prior to Admission medications   Medication Sig Start Date End Date Taking? Authorizing Provider  amoxicillin (AMOXIL) 500 MG capsule Take 2 capsules (1,000 mg total) by mouth 2 (two) times daily. For 10 days 11/09/17   Ree Shay, MD  pantoprazole (PROTONIX) 20 MG tablet Take 1 tablet (20 mg total) by mouth daily. 06/05/16   Audry Pili, PA-C    Family History Family History  Family history unknown: Yes    Social History Social History   Tobacco Use  . Smoking status: Current Every Day Smoker    Packs/day: 0.50    Types: Cigars, Cigarettes  . Smokeless tobacco: Never Used  Substance Use Topics  . Alcohol use: Yes    Comment: occassional  . Drug use: Yes    Types: Marijuana     Allergies   Patient has no known allergies.   Review of Systems Review of Systems  All other systems reviewed and are negative.    Physical Exam Updated Vital Signs BP 130/84 (BP Location: Right Arm)   Pulse 99   Temp  98.4 F (36.9 C) (Oral)   Resp 18   Ht  (1.499 m)   Wt 78.9 kg (174 lb)   LMP 11/17/2017 (Approximate)   SpO2 100%   BMI 35.14 kg/m   Physical Exam  Constitutional: She is oriented to person, place, and time. She appears well-developed and well-nourished.  HENT:  Head: Normocephalic and atraumatic.  Mouth/Throat: Oropharynx is clear and moist and mucous membranes are normal. Tonsils are 1+ on the right. Tonsils are 1+ on the left. No tonsillar exudate.  Eyes: Pupils are equal, round, and reactive to light. Conjunctivae are normal. Right eye exhibits no discharge. Left eye exhibits no discharge. No scleral icterus.  Neck: Normal range of motion. No JVD present. No tracheal deviation present.  Pulmonary/Chest: Effort normal. No stridor. No respiratory distress. She has no wheezes. She has no rales. She exhibits no tenderness.  Neurological: She is alert and oriented to person, place, and time. Coordination normal.  Psychiatric: She has a normal mood and affect. Her behavior is normal. Judgment and thought content normal.  Nursing note and vitals reviewed.    ED Treatments / Results  Labs (all labs ordered are listed, but only abnormal results are displayed) Labs Reviewed  GROUP A STREP BY PCR    EKG None  Radiology No results found.  Procedures Procedures (including critical  care time)  Medications Ordered in ED Medications - No data to display   Initial Impression / Assessment and Plan / ED Course  I have reviewed the triage vital signs and the nursing notes.  Pertinent labs & imaging results that were available during my care of the patient were reviewed by me and considered in my medical decision making (see chart for details).     Labs: Group A strep by PCR negative  Imaging:  Consults:  Therapeutics:  Discharge Meds: Tessalon, Claritin  Assessment/Plan: 22 year old female presents today with likely viral pharyngitis versus seasonal allergies.  She  has no signs of bacterial infection here, no significant swelling or edema of the oropharynx, patient treated with Claritin, cough medication, given strict return precautions.  She verbalized understanding and agreement to today's plan had no further questions or concerns.    Final Clinical Impressions(s) / ED Diagnoses   Final diagnoses:  Viral pharyngitis    ED Discharge Orders    None       Rosalio Loud 12/11/17 1610    Cardama, Amadeo Garnet, MD 12/12/17 1340

## 2017-12-11 NOTE — ED Triage Notes (Signed)
Pt endorses swollen tonsils x 1 week, here for same last month and was diagnosed with strep, took antibiotics and it went away, pt states "this does not feel like strep" Airway intact. VSS.

## 2017-12-23 ENCOUNTER — Emergency Department (HOSPITAL_COMMUNITY)
Admission: EM | Admit: 2017-12-23 | Discharge: 2017-12-24 | Disposition: A | Discharge status: Home / Self Care | Payer: Self-pay | Attending: Emergency Medicine | Admitting: Emergency Medicine

## 2017-12-23 DIAGNOSIS — F17210 Nicotine dependence, cigarettes, uncomplicated: Secondary | ICD-10-CM | POA: Insufficient documentation

## 2017-12-23 DIAGNOSIS — Z79899 Other long term (current) drug therapy: Secondary | ICD-10-CM | POA: Insufficient documentation

## 2017-12-23 DIAGNOSIS — H60332 Swimmer's ear, left ear: Secondary | ICD-10-CM | POA: Insufficient documentation

## 2017-12-23 NOTE — ED Notes (Signed)
No answer for triage x2 

## 2017-12-24 ENCOUNTER — Encounter (HOSPITAL_COMMUNITY): Payer: Self-pay | Admitting: Emergency Medicine

## 2017-12-24 ENCOUNTER — Other Ambulatory Visit: Payer: Self-pay

## 2017-12-24 ENCOUNTER — Emergency Department (HOSPITAL_COMMUNITY)
Admission: EM | Admit: 2017-12-24 | Discharge: 2017-12-24 | Disposition: A | Discharge status: Home / Self Care | Payer: Self-pay | Attending: Emergency Medicine | Admitting: Emergency Medicine

## 2017-12-24 DIAGNOSIS — F17210 Nicotine dependence, cigarettes, uncomplicated: Secondary | ICD-10-CM | POA: Insufficient documentation

## 2017-12-24 DIAGNOSIS — H6092 Unspecified otitis externa, left ear: Secondary | ICD-10-CM | POA: Insufficient documentation

## 2017-12-24 DIAGNOSIS — Z79899 Other long term (current) drug therapy: Secondary | ICD-10-CM | POA: Insufficient documentation

## 2017-12-24 MED ORDER — AMOXICILLIN-POT CLAVULANATE 875-125 MG PO TABS: 1.0000 | ORAL_TABLET | Freq: Two times a day (BID) | ORAL | 0 refills | Status: DC

## 2017-12-24 MED ORDER — ACETAMINOPHEN 325 MG PO TABS: 650.0000 mg | ORAL_TABLET | Freq: Four times a day (QID) | ORAL | 0 refills | Status: DC | PRN

## 2017-12-24 MED ORDER — CIPROFLOXACIN-DEXAMETHASONE 0.3-0.1 % OT SUSP: 4.0000 [drp] | Freq: Two times a day (BID) | OTIC | 0 refills | Status: AC

## 2017-12-24 MED ORDER — IBUPROFEN 400 MG PO TABS: 400.0000 mg | ORAL_TABLET | Freq: Four times a day (QID) | ORAL | 0 refills | Status: DC | PRN

## 2017-12-24 MED ORDER — OFLOXACIN 0.3 % OT SOLN: 10.0000 [drp] | Freq: Every day | OTIC | 0 refills | Status: AC

## 2017-12-24 NOTE — ED Triage Notes (Signed)
Pt states she was in [place] last week and got water in her left ear.  Was using a q-tip and thought she had gotten the water out and it was improved.  Started having pain again Sunday that has been constant and hurts with movement. Has taken two 800 mg ibuprofen at 2100 tonight.
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
## 2017-12-24 NOTE — Discharge Instructions (Signed)
Apply eardrops as prescribed Take ibuprofen 600 mg every 6 hours with food for additional pain relief. Follow attached handouts Follow-up with your PCP in the next 2 days. Contact a health care provider if: You have a fever. After 3 days your ear is still red, swollen, painful, or draining pus. Your redness, swelling, or pain gets worse. You have a severe headache. You have redness, swelling, pain, or tenderness in the area behind your ear.
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
## 2017-12-24 NOTE — ED Triage Notes (Signed)
Patient complains of continued ear pain. Patient was seen for same last night and prescribed ciprodex but when she went to pharmacy, price was greater than $200 and patient was unable to afford.
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
## 2017-12-24 NOTE — Discharge Instructions (Addendum)
You were given a prescription for antibiotic ear drops. Please use the antibiotic prescription fully.   I have prescribed a new medication for you today. It is important that when you pick the prescription up you discuss the potential interactions of this medication with other medications you are taking, including over the counter medications, with the pharmacists.   This new medication has potential side effects. Be sure to contact your primary care provider or return to the emergency department if you are experiencing new symptoms that you are unable to tolerate after starting the medication. You need to receive medical evaluation immediately if you start to experience blistering of the skin, rash, swelling, or difficulty breathing as these signs could indicate a more serious medication side effect.   You may alternate taking Tylenol and Ibuprofen as needed for pain control. You may take 400-600 mg of ibuprofen every 6 hours and [phone] mg of Tylenol every 6 hours. Do not exceed 4000 mg of Tylenol daily as this can lead to liver damage. Also, make sure to take Ibuprofen with meals as it can cause an upset stomach. Do not take other NSAIDs while taking Ibuprofen such as (Aleve, Naprosyn, Aspirin, Celebrex, etc) and do not take more than the prescribed dose as this can lead to ulcers and bleeding in your GI tract.   Please follow up with your primary doctor within the next 7-10 days for re-evaluation and further treatment of your symptoms.   Please return to the ER sooner if you have any new or worsening symptoms.

## 2020-03-01 ENCOUNTER — Emergency Department (HOSPITAL_COMMUNITY): Payer: Self-pay

## 2020-03-01 ENCOUNTER — Other Ambulatory Visit: Payer: Self-pay

## 2020-03-01 ENCOUNTER — Emergency Department (HOSPITAL_COMMUNITY)
Admission: EM | Admit: 2020-03-01 | Discharge: 2020-03-01 | Disposition: A | Discharge status: Home / Self Care | Payer: Self-pay | Attending: Emergency Medicine | Admitting: Emergency Medicine

## 2020-03-01 ENCOUNTER — Encounter (HOSPITAL_COMMUNITY): Payer: Self-pay

## 2020-03-01 DIAGNOSIS — Y998 Other external cause status: Secondary | ICD-10-CM | POA: Insufficient documentation

## 2020-03-01 DIAGNOSIS — Y92410 Unspecified street and highway as the place of occurrence of the external cause: Secondary | ICD-10-CM | POA: Insufficient documentation

## 2020-03-01 DIAGNOSIS — W2210XA Striking against or struck by unspecified automobile airbag, initial encounter: Secondary | ICD-10-CM | POA: Insufficient documentation

## 2020-03-01 DIAGNOSIS — F1210 Cannabis abuse, uncomplicated: Secondary | ICD-10-CM | POA: Insufficient documentation

## 2020-03-01 DIAGNOSIS — Y9389 Activity, other specified: Secondary | ICD-10-CM | POA: Insufficient documentation

## 2020-03-01 DIAGNOSIS — F17210 Nicotine dependence, cigarettes, uncomplicated: Secondary | ICD-10-CM | POA: Insufficient documentation

## 2020-03-01 DIAGNOSIS — M25562 Pain in left knee: Secondary | ICD-10-CM | POA: Insufficient documentation

## 2020-03-01 DIAGNOSIS — F17290 Nicotine dependence, other tobacco product, uncomplicated: Secondary | ICD-10-CM | POA: Insufficient documentation

## 2020-03-01 MED ORDER — IBUPROFEN 200 MG PO TABS
600.0000 mg | ORAL_TABLET | Freq: Once | ORAL | Status: AC
  Administered 2020-03-01: 600 mg via ORAL
  Filled 2020-03-01: qty 3

## 2020-03-01 MED ORDER — METHOCARBAMOL 500 MG PO TABS: 500.0000 mg | ORAL_TABLET | Freq: Two times a day (BID) | ORAL | 0 refills | Status: DC | PRN

## 2020-03-01 MED ORDER — ACETAMINOPHEN 325 MG PO TABS
650.0000 mg | ORAL_TABLET | Freq: Once | ORAL | Status: AC
  Administered 2020-03-01: 650 mg via ORAL
  Filled 2020-03-01: qty 2
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
## 2020-03-14 ENCOUNTER — Other Ambulatory Visit: Payer: Self-pay

## 2020-03-14 ENCOUNTER — Emergency Department (HOSPITAL_COMMUNITY)
Admission: EM | Admit: 2020-03-14 | Discharge: 2020-03-14 | Disposition: A | Discharge status: Home / Self Care | Payer: Self-pay | Attending: Emergency Medicine | Admitting: Emergency Medicine

## 2020-03-14 ENCOUNTER — Encounter (HOSPITAL_COMMUNITY): Payer: Self-pay

## 2020-03-14 DIAGNOSIS — F17290 Nicotine dependence, other tobacco product, uncomplicated: Secondary | ICD-10-CM | POA: Insufficient documentation

## 2020-03-14 DIAGNOSIS — Y929 Unspecified place or not applicable: Secondary | ICD-10-CM | POA: Insufficient documentation

## 2020-03-14 DIAGNOSIS — S8012XA Contusion of left lower leg, initial encounter: Secondary | ICD-10-CM | POA: Insufficient documentation

## 2020-03-14 DIAGNOSIS — Y939 Activity, unspecified: Secondary | ICD-10-CM | POA: Insufficient documentation

## 2020-03-14 DIAGNOSIS — R200 Anesthesia of skin: Secondary | ICD-10-CM | POA: Insufficient documentation

## 2020-03-14 DIAGNOSIS — Y999 Unspecified external cause status: Secondary | ICD-10-CM | POA: Insufficient documentation

## 2020-03-14 DIAGNOSIS — M25562 Pain in left knee: Secondary | ICD-10-CM
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
## 2021-03-23 ENCOUNTER — Other Ambulatory Visit: Payer: Self-pay

## 2021-03-23 ENCOUNTER — Emergency Department (HOSPITAL_BASED_OUTPATIENT_CLINIC_OR_DEPARTMENT_OTHER): Payer: Self-pay

## 2021-03-23 ENCOUNTER — Encounter (HOSPITAL_BASED_OUTPATIENT_CLINIC_OR_DEPARTMENT_OTHER): Payer: Self-pay

## 2021-03-23 ENCOUNTER — Emergency Department (HOSPITAL_BASED_OUTPATIENT_CLINIC_OR_DEPARTMENT_OTHER)
Admission: EM | Admit: 2021-03-23 | Discharge: 2021-03-23 | Disposition: A | Discharge status: Home / Self Care | Payer: Self-pay | Attending: Emergency Medicine | Admitting: Emergency Medicine

## 2021-03-23 DIAGNOSIS — F17290 Nicotine dependence, other tobacco product, uncomplicated: Secondary | ICD-10-CM | POA: Insufficient documentation

## 2021-03-23 DIAGNOSIS — T189XXA Foreign body of alimentary tract, part unspecified, initial encounter: Secondary | ICD-10-CM | POA: Insufficient documentation

## 2021-03-23 DIAGNOSIS — X58XXXA Exposure to other specified factors, initial encounter: Secondary | ICD-10-CM | POA: Insufficient documentation

## 2021-03-23 MED ORDER — LIDOCAINE VISCOUS HCL 2 % MT SOLN
15.0000 mL | Freq: Once | OROMUCOSAL | Status: AC
  Administered 2021-03-23: 15 mL via ORAL
  Filled 2021-03-23: qty 15

## 2021-03-23 MED ORDER — ALUM & MAG HYDROXIDE-SIMETH 200-200-20 MG/5ML PO SUSP
30.0000 mL | Freq: Once | ORAL | Status: AC
  Administered 2021-03-23: 30 mL via ORAL
  Filled 2021-03-23: qty 30

## 2021-03-23 NOTE — ED Provider Notes (Signed)
[facility] EMERGENCY DEPARTMENT Provider Note   CSN: [id number] Arrival date & time: 03/23/21  1951     History Chief Complaint  Patient presents with   Swallowed Foreign Body    Taylor Reeves is a 25 y.o. female.   Swallowed Foreign Body   Patient presents with concern about a swallowed foreign body.  States she thinks she swallowed a [place] bone while she was eating dinner.  It feels stuck in her throat, has not been alleviated by anything.  Worsened by swallowing.  She had 1 episode where she regurgitated food, but did not actively throw up.  She is not feeling any shortness of breath, chest pain, abdominal pain.  History reviewed. No pertinent past medical history.  There are no problems to display for this patient.   History reviewed. No pertinent surgical history.   OB History   No obstetric history on file.     Family History  Family history unknown: Yes    Social History   Tobacco Use   Smoking status: Every Day    Types: Cigars   Smokeless tobacco: Never  Vaping Use   Vaping Use: Former  Substance Use Topics   Alcohol use: Not Currently   Drug use: Yes    Types: Marijuana    Home Medications Prior to Admission medications   Medication Sig Start Date End Date Taking? Authorizing Provider  methocarbamol (ROBAXIN) 500 MG tablet Take 1 tablet (500 mg total) by mouth 2 (two) times daily as needed for muscle spasms. 03/01/20   Walisiewicz, Yvonna Alanis E, PA-C  loratadine (CLARITIN) 10 MG tablet Take 1 tablet (10 mg total) by mouth daily. 12/11/17 03/01/20  Hedges, Tinnie Gens, PA-C  pantoprazole (PROTONIX) 20 MG tablet Take 1 tablet (20 mg total) by mouth daily. 06/05/16 03/01/20  Audry Pili, PA-C    Allergies    Patient has no known allergies.  Review of Systems   Review of Systems  Constitutional:  Negative for fever.  Gastrointestinal:  Negative for nausea and vomiting.       Dysphagia, possible foreign body in throat   Physical Exam Updated  Vital Signs BP 106/80 (BP Location: Left Arm)   Pulse 71   Temp 98.6 F (37 C) (Oral)   Resp 18   Ht 4\' 11"  (1.499 m)   Wt 91.6 kg   LMP 03/19/2021   SpO2 98%   BMI 40.80 kg/m   Physical Exam Vitals and nursing note reviewed. Exam conducted with a chaperone present.  Constitutional:      General: She is not in acute distress.    Appearance: Normal appearance.  HENT:     Head: Normocephalic and atraumatic.     Mouth/Throat:     Mouth: Mucous membranes are moist.     Comments: Airway is clear, there is some mild erythema the posterior oropharynx, but no active bleeding.  No foreign body visualized.  Handling secretions appropriately. Eyes:     General: No scleral icterus.    Extraocular Movements: Extraocular movements intact.     Pupils: Pupils are equal, round, and reactive to light.  Pulmonary:     Effort: Pulmonary effort is normal.     Breath sounds: Normal breath sounds.     Comments: No stridor Musculoskeletal:     Cervical back: Normal range of motion. No tenderness.  Skin:    Coloration: Skin is not jaundiced.  Neurological:     Mental Status: She is alert. Mental status is at baseline.  Coordination: Coordination normal.    ED Results / Procedures / Treatments   Labs (all labs ordered are listed, but only abnormal results are displayed) Labs Reviewed - No data to display  EKG None  Radiology DG Neck Soft Tissue  Result Date: 03/23/2021 CLINICAL DATA:  Possible bones stuck in throat EXAM: NECK SOFT TISSUES - 1+ VIEW COMPARISON:  None. FINDINGS: There is no evidence of retropharyngeal soft tissue swelling or epiglottic enlargement. The cervical airway is unremarkable and no radio-opaque foreign body identified. IMPRESSION: Negative. Electronically Signed   By: Jasmine Pang M.D.   On: 03/23/2021 20:23    Procedures Procedures   Medications Ordered in ED Medications  alum & mag hydroxide-simeth (MAALOX/MYLANTA) 200-200-20 MG/5ML suspension 30 mL (has  no administration in time range)    And  lidocaine (XYLOCAINE) 2 % viscous mouth solution 15 mL (has no administration in time range)    ED Course  I have reviewed the triage vital signs and the nursing notes.  Pertinent labs & imaging results that were available during my care of the patient were reviewed by me and considered in my medical decision making (see chart for details).    MDM Rules/Calculators/A&P                           Patient vitals are stable, physical exam is unrevealing.  CT neck ordered to assess for possible foreign body, it is negative.  Possible that it could have been missed if it was a small and a fracture of bone, but overall reassuring.  Patient is able to tolerate fluids by mouth.  Given GI cocktail here in the ED.  Patient education performed, discussed that even though CT and physical exam were reassuring, we cannot definitively say there was no small piece of bone left in her throat.  Advised her to continue eating soft foods and to take Tums as needed for heartburn.  Advised her to return back to the ED if she starts having difficulty breathing, chest pain, abdominal pain, difficulty handling secretions.  Patient verbalized understanding and agreement with the plan.  Final Clinical Impression(s) / ED Diagnoses Final diagnoses:  None    Rx / DC Orders ED Discharge Orders     None        Theron Arista, Cordelia Poche 03/23/21 2114    Virgina Norfolk, DO 03/23/21 2246

## 2021-03-23 NOTE — Discharge Instructions (Addendum)
There were no signs of a foreign body on your CT scan today.  Your physical exam was also reassuring.  That said, it is possible that if he swallowed a bone that was significantly small that it would not show up on the CT imaging.  If you develop chest pain, difficulty swallowing, abdominal pain, please return back to the ED for further evaluation.  In the meantime, you may take, as as needed for indigestion.  I expect he will feel better in the next few days.  Take to soft foods when possible for the next 2 days as her esophagus is healing.
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
## 2022-09-24 IMAGING — DX DG NECK SOFT TISSUE
2 series · 2 of 2 positions shown · non-contrast
Comparison: None.

CLINICAL DATA: Possible bones stuck in throat

EXAM:
NECK SOFT TISSUES - 1+ VIEW

[neck ap]
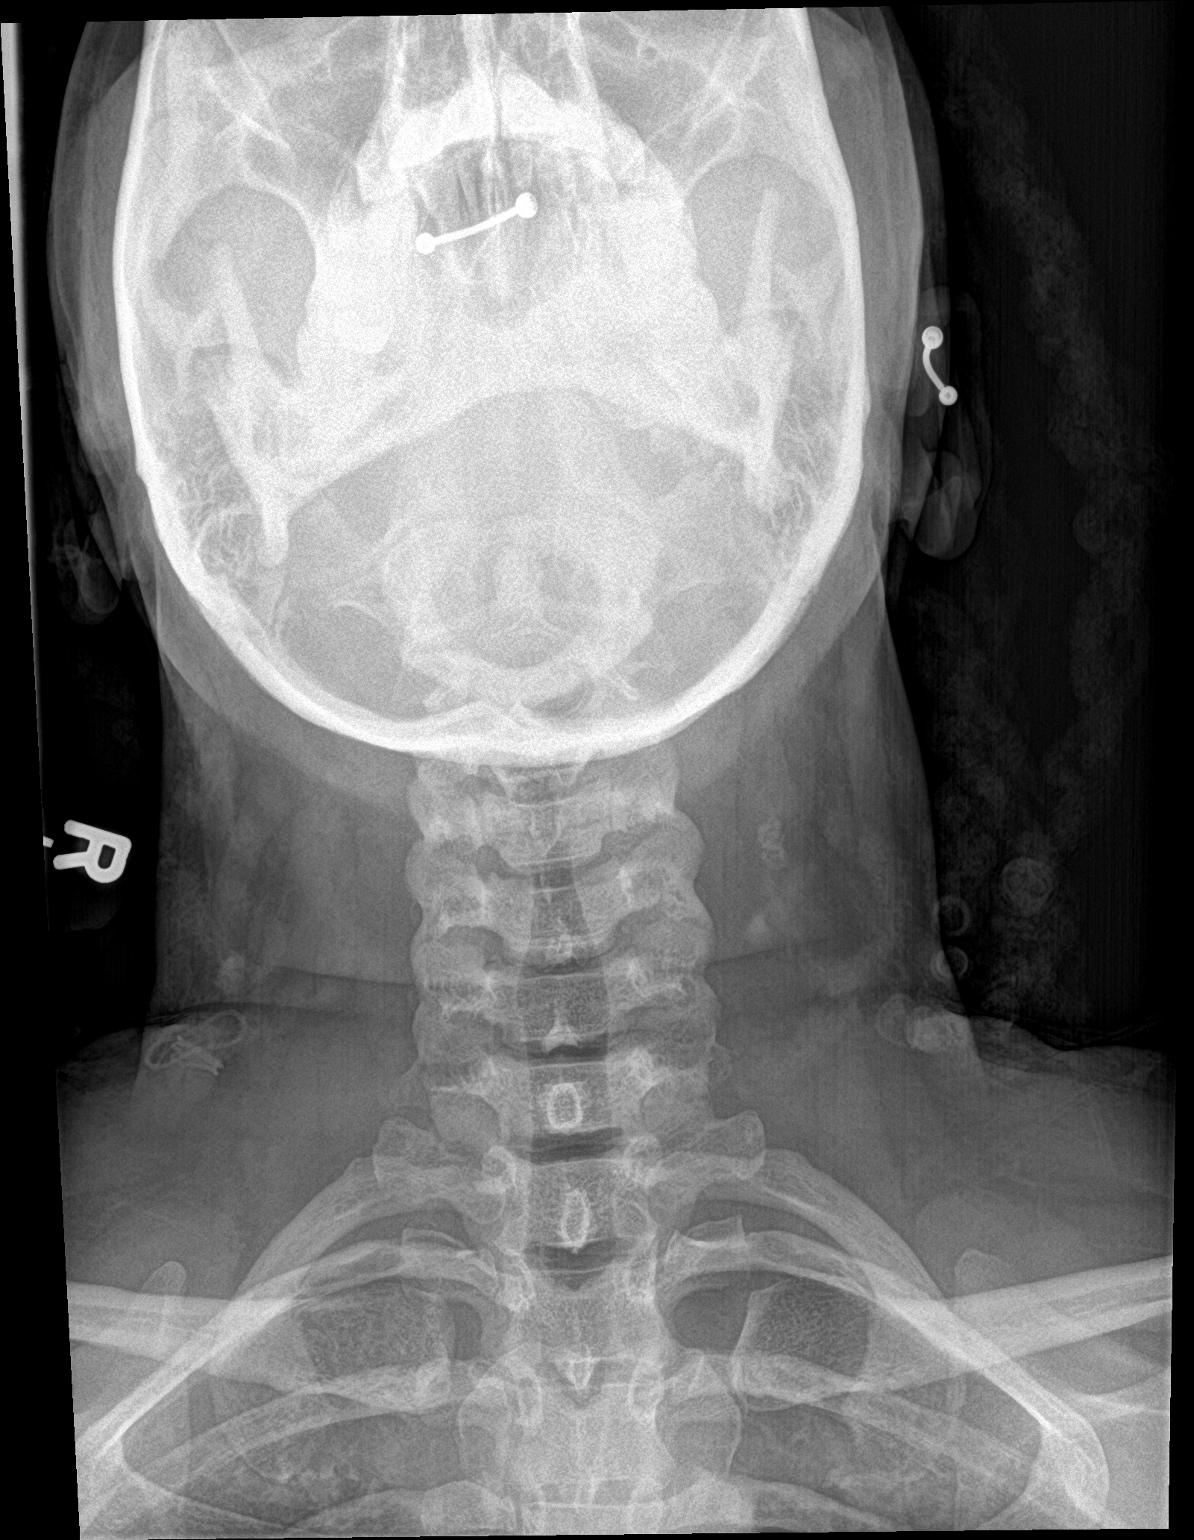

[neck lat]
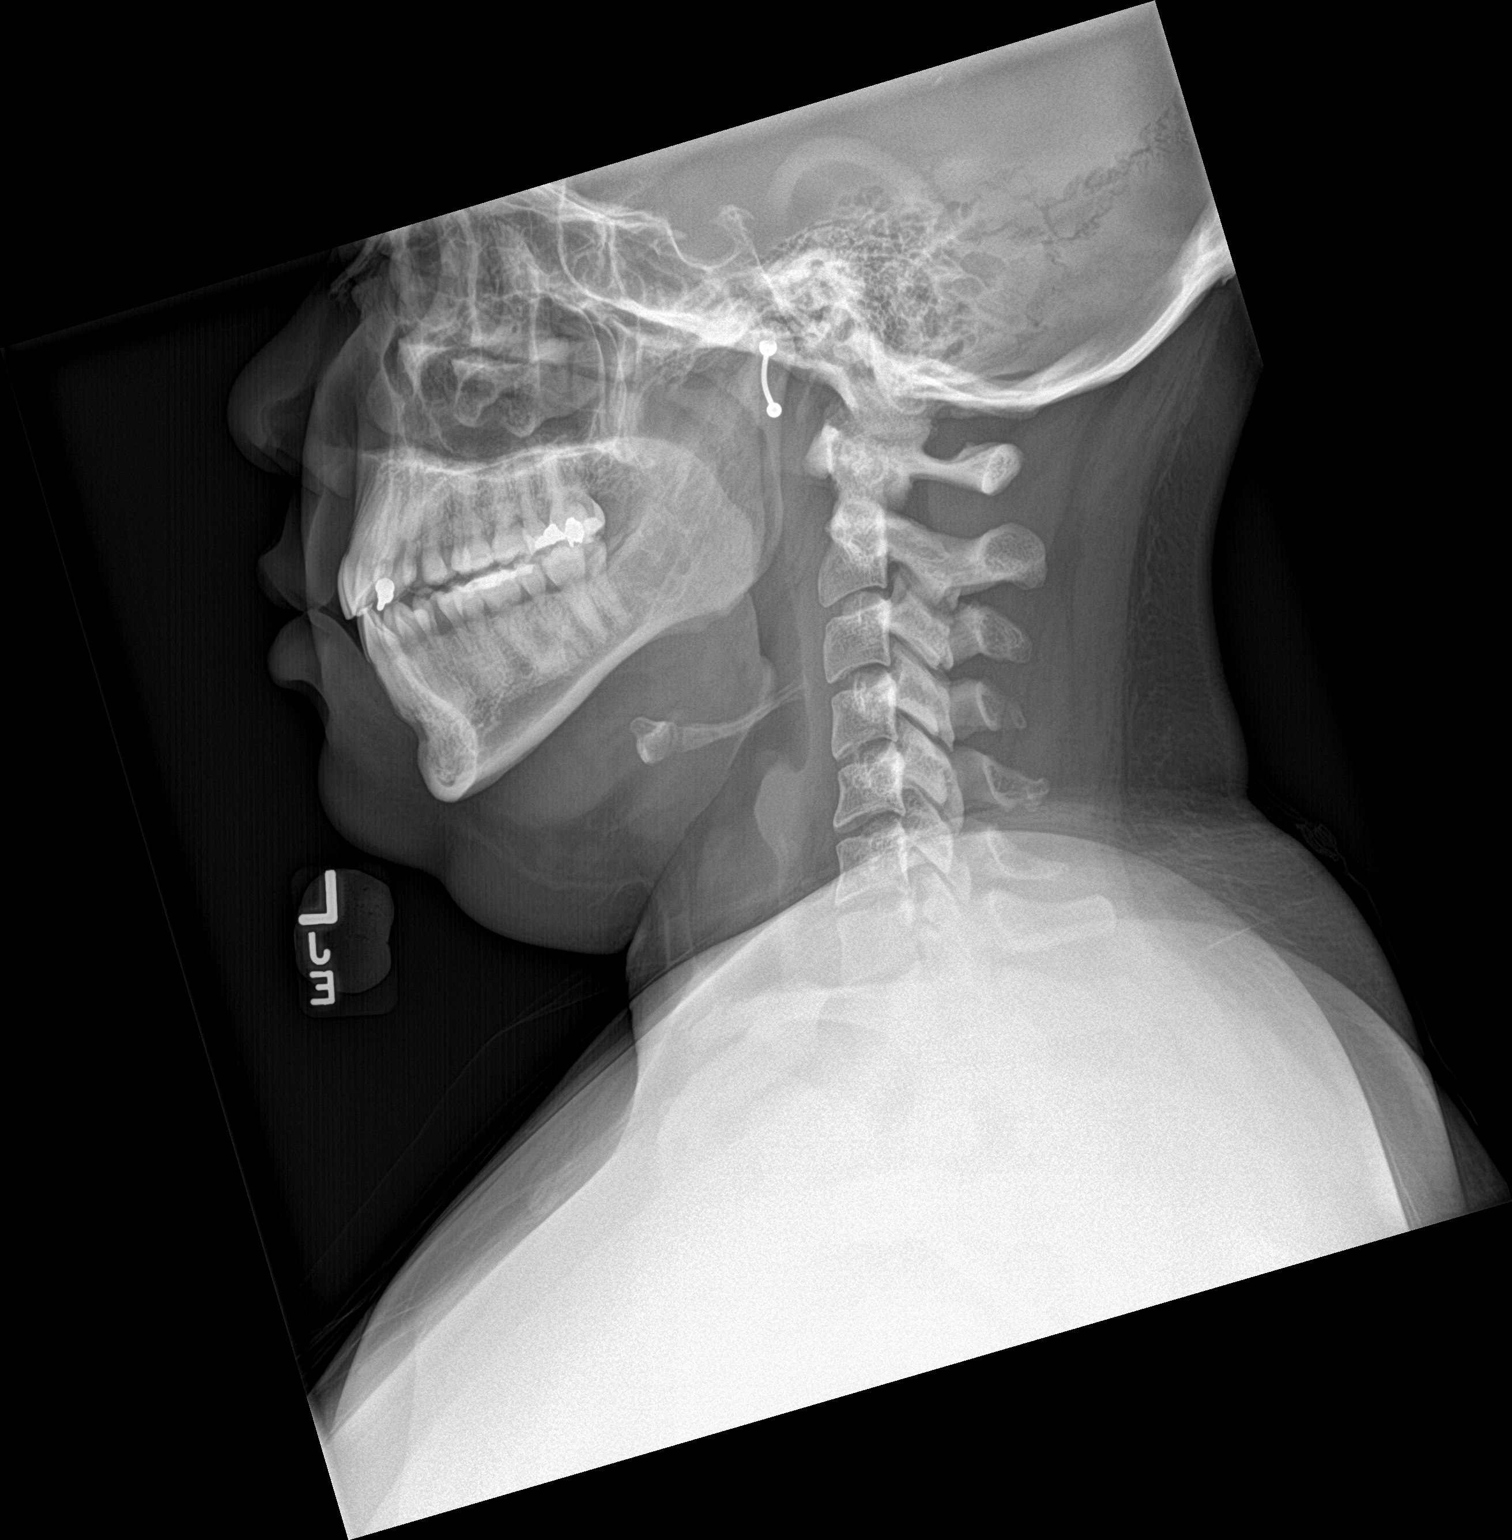

[2 of 2 positions shown; findings below may reference images not displayed]

FINDINGS: There is no evidence of retropharyngeal soft tissue swelling or
epiglottic enlargement. The cervical airway is unremarkable and no
radio-opaque foreign body identified.
IMPRESSION: Negative.

## 2022-10-08 ENCOUNTER — Telehealth: Payer: No Typology Code available for payment source | Admitting: Physician Assistant

## 2022-10-08 DIAGNOSIS — M546 Pain in thoracic spine: Secondary | ICD-10-CM

## 2022-10-08 MED ORDER — CYCLOBENZAPRINE HCL 10 MG PO TABS: 5.0000 mg | ORAL_TABLET | Freq: Three times a day (TID) | ORAL | 0 refills | Status: AC | PRN

## 2022-10-08 MED ORDER — NAPROXEN 500 MG PO TABS: 500.0000 mg | ORAL_TABLET | Freq: Two times a day (BID) | ORAL | 0 refills | Status: DC

## 2022-10-08 NOTE — Progress Notes (Signed)

## 2022-12-30 ENCOUNTER — Telehealth: Payer: No Typology Code available for payment source | Admitting: Nurse Practitioner

## 2022-12-30 ENCOUNTER — Encounter: Payer: No Typology Code available for payment source | Admitting: Nurse Practitioner

## 2022-12-30 ENCOUNTER — Encounter: Payer: Self-pay | Admitting: Nurse Practitioner

## 2022-12-30 ENCOUNTER — Telehealth: Payer: Self-pay | Admitting: Nurse Practitioner

## 2022-12-30 DIAGNOSIS — J3089 Other allergic rhinitis: Secondary | ICD-10-CM

## 2022-12-30 MED ORDER — FLUTICASONE PROPIONATE 50 MCG/ACT NA SUSP: 2.0000 | Freq: Every day | NASAL | 0 refills | Status: DC

## 2022-12-30 MED ORDER — LORATADINE 10 MG PO TABS: 10.0000 mg | ORAL_TABLET | Freq: Every day | ORAL | 0 refills | Status: DC

## 2022-12-30 MED ORDER — OLOPATADINE HCL 0.1 % OP SOLN: 1.0000 [drp] | Freq: Two times a day (BID) | OPHTHALMIC | 0 refills | Status: DC

## 2022-12-30 NOTE — Progress Notes (Signed)
duplicate

## 2022-12-30 NOTE — Progress Notes (Signed)
I have spent 5 minutes in review of e-visit questionnaire, review and updating patient chart, medical decision making and response to patient.  ° °Jeris Easterly W Roni Friberg, NP ° °  °

## 2022-12-30 NOTE — Progress Notes (Signed)
E visit for Allergic Rhinitis We are sorry that you are not feeling well.  Here is how we plan to help!  Based on what you have shared with me it looks like you have Allergic Rhinitis.  Rhinitis is when a reaction occurs that causes nasal congestion, runny nose, sneezing, and itching.  Most types of rhinitis are caused by an inflammation and are associated with symptoms in the eyes ears or throat. There are several types of rhinitis.  The most common are acute rhinitis, which is usually caused by a viral illness, allergic or seasonal rhinitis, and nonallergic or year-round rhinitis.  Nasal allergies occur certain times of the year.  Allergic rhinitis is caused when allergens in the air trigger the release of histamine in the body.  Histamine causes itching, swelling, and fluid to build up in the fragile linings of the nasal passages, sinuses and eyelids.  An itchy nose and clear discharge are common.  I recommend the following over the counter treatments: You should take a daily dose of antihistamine. I have sent claritin to the pharmacy   I also would recommend a nasal spray: Flonase 2 sprays into each nostril once daily  You may also benefit from eye drops such as: Visine 1-2 drops each eye twice daily as needed for allergies  HOME CARE:  You can use an over-the-counter saline nasal spray as needed Avoid areas where there is heavy dust, mites, or molds Stay indoors on windy days during the pollen season Keep windows closed in home, at least in bedroom; use air conditioner. Use high-efficiency house air filter Keep windows closed in car, turn AC on re-circulate Avoid playing out with dog during pollen season  GET HELP RIGHT AWAY IF:  If your symptoms do not improve within 10 days You become short of breath You develop yellow or green discharge from your nose for over 3 days You have coughing fits  MAKE SURE YOU:  Understand these instructions Will watch your condition Will get  help right away if you are not doing well or get worse  Thank you for choosing an e-visit. Your e-visit answers were reviewed by a board certified advanced clinical practitioner to complete your personal care plan. Depending upon the condition, your plan could have included both over the counter or prescription medications. Please review your pharmacy choice. Be sure that the pharmacy you have chosen is open so that you can pick up your prescription now.  If there is a problem you may message your provider in MyChart to have the prescription routed to another pharmacy. Your safety is important to [place]. If you have drug allergies check your prescription carefully.  For the next 24 hours, you can use MyChart to ask questions about today's visit, request a non-urgent call back, or ask for a work or school excuse from your e-visit provider. You will get an email in the next two days asking about your experience. I hope that your e-visit has been valuable and will speed your recovery.

## 2023-01-13 ENCOUNTER — Telehealth: Payer: No Typology Code available for payment source | Admitting: Physician Assistant

## 2023-01-13 DIAGNOSIS — R112 Nausea with vomiting, unspecified: Secondary | ICD-10-CM

## 2023-01-13 MED ORDER — ONDANSETRON HCL 4 MG PO TABS: 4.0000 mg | ORAL_TABLET | Freq: Three times a day (TID) | ORAL | 0 refills | Status: AC | PRN

## 2023-01-13 NOTE — Progress Notes (Signed)
E-Visit for Nausea and Vomiting   We are sorry that you are not feeling well. Here is how we plan to help!  Based on what you have shared with me it looks like you have a Virus that is irritating your GI tract.  Vomiting is the forceful emptying of a portion of the stomach's content through the mouth.  Although nausea and vomiting can make you feel miserable, it's important to remember that these are not diseases, but rather symptoms of an underlying illness.  When we treat short term symptoms, we always caution that any symptoms that persist should be fully evaluated in a medical office.  I have prescribed a medication that will help alleviate your symptoms and allow you to stay hydrated:  Zofran 4 mg 1 tablet every 8 hours as needed for nausea and vomiting  HOME CARE: Drink clear liquids.  This is very important! Dehydration (the lack of fluid) can lead to a serious complication.  Start off with 1 tablespoon every 5 minutes for 8 hours. You may begin eating bland foods after 8 hours without vomiting.  Start with saltine crackers, white bread, rice, mashed potatoes, applesauce. After 48 hours on a bland diet, you may resume a normal diet. Try to go to sleep.  Sleep often empties the stomach and relieves the need to vomit.  GET HELP RIGHT AWAY IF:  Your symptoms do not improve or worsen within 2 days after treatment. You have a fever for over 3 days. You cannot keep down fluids after trying the medication.  MAKE SURE YOU:  Understand these instructions. Will watch your condition. Will get help right away if you are not doing well or get worse.    Thank you for choosing an e-visit.  Your e-visit answers were reviewed by a board certified advanced clinical practitioner to complete your personal care plan. Depending upon the condition, your plan could have included both over the counter or prescription medications.  Please review your pharmacy choice. Make sure the pharmacy is open so  you can pick up prescription now. If there is a problem, you may contact your provider through MyChart messaging and have the prescription routed to another pharmacy.  Your safety is important to us. If you have drug allergies check your prescription carefully.   For the next 24 hours you can use MyChart to ask questions about today's visit, request a non-urgent call back, or ask for a work or school excuse. You will get an email in the next two days asking about your experience. I hope that your e-visit has been valuable and will speed your recovery.   I have spent 5 minutes in review of e-visit questionnaire, review and updating patient chart, medical decision making and response to patient.   Kharisma Glasner Z Ward, PA-C    

## 2023-02-26 ENCOUNTER — Telehealth: Payer: Self-pay | Admitting: Physician Assistant

## 2023-02-26 ENCOUNTER — Encounter: Payer: Self-pay | Admitting: Family Medicine

## 2023-02-26 ENCOUNTER — Other Ambulatory Visit (HOSPITAL_COMMUNITY): Payer: Self-pay

## 2023-02-26 DIAGNOSIS — U071 COVID-19: Secondary | ICD-10-CM

## 2023-02-26 MED ORDER — FLUTICASONE PROPIONATE 50 MCG/ACT NA SUSP
2.0000 | Freq: Every day | NASAL | 0 refills | Status: DC
  Filled 2023-02-26: qty 16, 30d supply, fill #0

## 2023-02-26 MED ORDER — BENZONATATE 100 MG PO CAPS
100.0000 mg | ORAL_CAPSULE | Freq: Three times a day (TID) | ORAL | 0 refills | Status: DC | PRN
  Filled 2023-02-26: qty 30, 10d supply, fill #0

## 2023-02-26 MED ORDER — ALBUTEROL SULFATE HFA 108 (90 BASE) MCG/ACT IN AERS
2.0000 | INHALATION_SPRAY | Freq: Four times a day (QID) | RESPIRATORY_TRACT | 0 refills | Status: DC | PRN
  Filled 2023-02-26: qty 6.7, 25d supply, fill #0

## 2023-02-26 NOTE — Progress Notes (Signed)
I have spent 5 minutes in review of e-visit questionnaire, review and updating patient chart, medical decision making and response to patient.   William Cody Martin, PA-C    

## 2023-02-26 NOTE — Progress Notes (Signed)
E-Visit  for Positive Covid Test Result   We are sorry you are not feeling well. We are here to help!  You have tested positive for COVID-19, meaning that you were infected with the novel coronavirus and could give the virus to others.  Most people with COVID-19 have mild illness and can recover at home without medical care. Do not leave your home, except to get medical care. Do not visit public areas and do not go to places where you are unable to wear a mask. It is important that you stay home  to take care for yourself and to help protect other people in your home and community.      Isolation Instructions:   You are to isolate at home until you have been fever free for at least 24 hours without a fever-reducing medication, and symptoms have been steadily improving for 24 hours. At that time,  you can end isolation but need to mask for an additional 5 days.  If you must be around other household members who do not have symptoms, you need to make sure that both you and the family members are masking consistently with a high-quality mask.  If you note any worsening of symptoms despite treatment, please seek an in-person evaluation ASAP. If you note any significant shortness of breath or any chest pain, please seek ER evaluation. Please do not delay care!   Go to the nearest hospital ED for assessment if fever/cough/breathlessness are severe or illness seems like a threat to life.    The following symptoms may appear 2-14 days after exposure: Fever Cough Shortness of breath or difficulty breathing Chills Repeated shaking with chills Muscle pain Headache Sore throat New loss of taste or smell Fatigue Congestion or runny nose Nausea or vomiting Diarrhea  You can use medication such as prescription cough medication called Tessalon Perles 100 mg. You may take 1-2 capsules every 8 hours as needed for cough,  prescription inhaler called Albuterol MDI 90 mcg /actuation 2 puffs every 4  hours as needed for shortness of breath, wheezing, cough, and prescription for Fluticasone nasal spray 2 sprays in each nostril one time per day. These have been sent in to your pharmacy.  You may also take acetaminophen (Tylenol) as needed for fever.  HOME CARE: Only take medications as instructed by your medical team. Drink plenty of fluids and get plenty of rest. A steam or ultrasonic humidifier can help if you have congestion.   GET HELP RIGHT AWAY IF YOU HAVE EMERGENCY WARNING SIGNS.  Call 911 or proceed to your closest emergency facility if: You develop worsening high fever. Trouble breathing Bluish lips or face Persistent pain or pressure in the chest New confusion Inability to wake or stay awake You cough up blood. Your symptoms become more severe Inability to hold down food or fluids  This list is not all possible symptoms. Contact your medical provider for any symptoms that are severe or concerning to you.   Your e-visit answers were reviewed by a board certified advanced clinical practitioner to complete your personal care plan.  Depending on the condition, your plan could have included both over the counter or prescription medications.  If there is a problem please reply once you have received a response from your provider.  Your safety is important to [place].  If you have drug allergies check your prescription carefully.    You can use MyChart to ask questions about today's visit, request a non-urgent call back, or  ask for a work or school excuse for 24 hours related to this e-Visit. If it has been greater than 24 hours you will need to follow up with your provider, or enter a new e-Visit to address those concerns. You will get an e-mail in the next two days asking about your experience.  I hope that your e-visit has been valuable and will speed your recovery. Thank you for using e-visits.

## 2023-02-26 NOTE — Progress Notes (Signed)
 Message sent to patient requesting further input regarding current symptoms. Awaiting patient response.  

## 2023-03-10 ENCOUNTER — Telehealth: Payer: Self-pay | Admitting: Nurse Practitioner

## 2023-03-10 DIAGNOSIS — B3731 Acute candidiasis of vulva and vagina: Secondary | ICD-10-CM

## 2023-03-10 MED ORDER — FLUCONAZOLE 150 MG PO TABS: 150.0000 mg | ORAL_TABLET | Freq: Once | ORAL | 0 refills | Status: DC

## 2023-03-10 MED ORDER — FLUCONAZOLE 150 MG PO TABS: 150.0000 mg | ORAL_TABLET | Freq: Once | ORAL | 0 refills | Status: AC

## 2023-03-10 NOTE — Progress Notes (Signed)
E-Visit for Vaginal Symptoms  I have sent a yeast pill for you to take. Other recommendations would be monistat over the counter or any vaginal itching cream over the counter.    We are sorry that you are not feeling well. Here is how we plan to help!   Based on what you shared with me it looks like you: May have a yeast vaginosis  Vaginosis is an inflammation of the vagina that can result in discharge, itching and pain. The cause is usually a change in the normal balance of vaginal bacteria or an infection. Vaginosis can also result from reduced estrogen levels after menopause.  The most common causes of vaginosis are:   Bacterial vaginosis which results from an overgrowth of one on several organisms that are normally present in your vagina.   Yeast infections which are caused by a naturally occurring fungus called candida.   Vaginal atrophy (atrophic vaginosis) which results from the thinning of the vagina from reduced estrogen levels after menopause.   Trichomoniasis which is caused by a parasite and is commonly transmitted by sexual intercourse.  Factors that increase your risk of developing vaginosis include: Medications, such as antibiotics and steroids Uncontrolled diabetes Use of hygiene products such as bubble bath, vaginal spray or vaginal deodorant Douching Wearing damp or tight-fitting clothing Using an intrauterine device (IUD) for birth control Hormonal changes, such as those associated with pregnancy, birth control pills or menopause Sexual activity Having a sexually transmitted infection  Your treatment plan is A single Diflucan (fluconazole) 150mg  tablet once.  I have electronically sent this prescription into the pharmacy that you have chosen.  Be sure to take all of the medication as directed. Stop taking any medication if you develop a rash, tongue swelling or shortness of breath. Mothers who are breast feeding should consider pumping and discarding their breast  milk while on these antibiotics. However, there is no consensus that infant exposure at these doses would be harmful.  Remember that medication creams can weaken latex condoms. Marland Kitchen   HOME CARE:  Good hygiene may prevent some types of vaginosis from recurring and may relieve some symptoms:  Avoid baths, hot tubs and whirlpool spas. Rinse soap from your outer genital area after a shower, and dry the area well to prevent irritation. Don't use scented or harsh soaps, such as those with deodorant or antibacterial action. Avoid irritants. These include scented tampons and pads. Wipe from front to back after using the toilet. Doing so avoids spreading fecal bacteria to your vagina.  Other things that may help prevent vaginosis include:  Don't douche. Your vagina doesn't require cleansing other than normal bathing. Repetitive douching disrupts the normal organisms that reside in the vagina and can actually increase your risk of vaginal infection. Douching won't clear up a vaginal infection. Use a latex condom. Both female and female latex condoms may help you avoid infections spread by sexual contact. Wear cotton underwear. Also wear pantyhose with a cotton crotch. If you feel comfortable without it, skip wearing underwear to bed. Yeast thrives in [employer] Your symptoms should improve in the next day or two.  GET HELP RIGHT AWAY IF:  You have pain in your lower abdomen ( pelvic area or over your ovaries) You develop nausea or vomiting You develop a fever Your discharge changes or worsens You have persistent pain with intercourse You develop shortness of breath, a rapid pulse, or you faint.  These symptoms could be signs of problems or infections that  need to be evaluated by a medical provider now.  MAKE SURE YOU   Understand these instructions. Will watch your condition. Will get help right away if you are not doing well or get worse.  Thank you for choosing an e-visit.  Your  e-visit answers were reviewed by a board certified advanced clinical practitioner to complete your personal care plan. Depending upon the condition, your plan could have included both over the counter or prescription medications.  Please review your pharmacy choice. Make sure the pharmacy is open so you can pick up prescription now. If there is a problem, you may contact your provider through [employer] and have the prescription routed to another pharmacy.  Your safety is important to [place]. If you have drug allergies check your prescription carefully.   For the next 24 hours you can use MyChart to ask questions about today's visit, request a non-urgent call back, or ask for a work or school excuse. You will get an email in the next two days asking about your experience. I hope that your e-visit has been valuable and will speed your recovery.

## 2023-03-10 NOTE — Progress Notes (Signed)
I have spent 5 minutes in review of e-visit questionnaire, review and updating patient chart, medical decision making and response to patient.  ° °Zelda W Fleming, NP ° °  °

## 2023-03-10 NOTE — Addendum Note (Signed)
Addended by: Georgana Curio on: 03/10/2023 10:13 AM   Modules accepted: Orders

## 2023-03-12 ENCOUNTER — Other Ambulatory Visit (HOSPITAL_COMMUNITY): Payer: Self-pay

## 2023-03-22 ENCOUNTER — Encounter (HOSPITAL_BASED_OUTPATIENT_CLINIC_OR_DEPARTMENT_OTHER): Payer: Self-pay

## 2023-03-22 ENCOUNTER — Emergency Department (HOSPITAL_BASED_OUTPATIENT_CLINIC_OR_DEPARTMENT_OTHER): Payer: Self-pay

## 2023-03-22 ENCOUNTER — Emergency Department (HOSPITAL_BASED_OUTPATIENT_CLINIC_OR_DEPARTMENT_OTHER): Payer: Self-pay | Admitting: Radiology

## 2023-03-22 ENCOUNTER — Emergency Department (HOSPITAL_BASED_OUTPATIENT_CLINIC_OR_DEPARTMENT_OTHER)
Admission: EM | Admit: 2023-03-22 | Discharge: 2023-03-22 | Disposition: A | Discharge status: Home / Self Care | Payer: Self-pay

## 2023-03-22 ENCOUNTER — Other Ambulatory Visit: Payer: Self-pay

## 2023-03-22 DIAGNOSIS — W010XXA Fall on same level from slipping, tripping and stumbling without subsequent striking against object, initial encounter: Secondary | ICD-10-CM | POA: Insufficient documentation

## 2023-03-22 DIAGNOSIS — S39012A Strain of muscle, fascia and tendon of lower back, initial encounter: Secondary | ICD-10-CM | POA: Insufficient documentation

## 2023-03-22 DIAGNOSIS — M25561 Pain in right knee: Secondary | ICD-10-CM

## 2023-03-22 DIAGNOSIS — M25562 Pain in left knee: Secondary | ICD-10-CM | POA: Insufficient documentation

## 2023-03-22 LAB — PREGNANCY, URINE: Preg Test, Ur: NEGATIVE

## 2023-03-22 MED ORDER — KETOROLAC TROMETHAMINE 15 MG/ML IJ SOLN
15.0000 mg | Freq: Once | INTRAMUSCULAR | Status: AC
  Administered 2023-03-22: 15 mg via INTRAMUSCULAR
  Filled 2023-03-22: qty 1

## 2023-03-22 MED ORDER — METHOCARBAMOL 500 MG PO TABS: 500.0000 mg | ORAL_TABLET | Freq: Two times a day (BID) | ORAL | 0 refills | Status: DC

## 2023-03-22 MED ORDER — LIDOCAINE 5 % EX PTCH: 1.0000 | MEDICATED_PATCH | CUTANEOUS | 0 refills | Status: AC

## 2023-03-22 MED ORDER — DEXAMETHASONE SODIUM PHOSPHATE 10 MG/ML IJ SOLN
10.0000 mg | Freq: Once | INTRAMUSCULAR | Status: AC
  Administered 2023-03-22: 10 mg via INTRAMUSCULAR
  Filled 2023-03-22: qty 1

## 2023-03-22 MED ORDER — LIDOCAINE 5 % EX PTCH
1.0000 | MEDICATED_PATCH | Freq: Once | CUTANEOUS | Status: DC
  Administered 2023-03-22: 1 via TRANSDERMAL
  Filled 2023-03-22: qty 1

## 2023-03-22 NOTE — ED Notes (Signed)
Patient verbalizes understanding of discharge instructions. Opportunity for questioning and answers were provided. Armband removed by staff, pt discharged from ED. Ambulated out to lobby  

## 2023-03-22 NOTE — ED Provider Notes (Signed)
Patient is a handoff from [place], [place].  Patient presents to the emergency department following a mechanical fall while at a grocery store.  Endorsing pain to the right knee and lumbar spine.  Pending imaging and radiology reads.  Patient has been given Toradol, Decadron, lidocaine patch for pain control. Physical Exam  BP 121/77 (BP Location: Right Arm)   Pulse 94   Temp 98.2 F (36.8 C) (Oral)   Resp 16   Ht 4\' 11"  (1.499 m)   Wt 97.5 kg   LMP 03/15/2023 (Approximate)   SpO2 98%   BMI 43.42 kg/m   Physical Exam Vitals and nursing note reviewed.  Constitutional:      General: She is not in acute distress.    Appearance: Normal appearance.  Eyes:     General: No scleral icterus.    Extraocular Movements: Extraocular movements intact.  Cardiovascular:     Rate and Rhythm: Normal rate.  Pulmonary:     Effort: Pulmonary effort is normal. No respiratory distress.  Abdominal:     Palpations: Abdomen is soft. There is no mass.     Tenderness: There is no abdominal tenderness.  Musculoskeletal:        General: Normal range of motion.     Cervical back: Neck supple.     Comments: Mild diffuse tenderness to palpation noted to right knee without appreciable swelling noted.  Abrasion noted to inferior aspect of right knee.  Able to flex and extend right knee against resistance.  Able to ambulate without assistance or difficulty.  Mild lumbar spinal and musculature tenderness to palpation.  No tenderness to palpation noted to C, T spine.  Skin:    General: Skin is warm and dry.     Findings: No rash.  Neurological:     Mental Status: She is alert.     Sensory: Sensation is intact.     Motor: Motor function is intact.  Psychiatric:        Behavior: Behavior normal.     Procedures  Procedures  ED Course / MDM    Medical Decision Making Amount and/or Complexity of Data Reviewed Labs: ordered. Radiology: ordered.  Risk Prescription drug management.   Patient is a handoff from  [place], [place].  Please see their note for full HPI and physical exam findings.  Plan at time of handoff is to await results of x-ray of right knee and CT lumbar spine.  Patient already given medications for pain control.  Likely able to discharge home with outpatient follow-up after imaging results.  X-ray imaging of the right knee and CT imaging of the lumbar spine are unremarkable at this time.  Reassessed patient and she does report some improvement in pain with medications given here.  Will plan discharging patient home with outpatient follow-up with primary care provider as well as over-the-counter medication use such as Tylenol, ibuprofen, Aleve.  A prescription for Lidoderm patches were sent to patient's pharmacy as well.  Patient is agreeable to treatment plan verbalized understanding or triple questions.  All questions answered prior to patient discharge.       Smitty Knudsen, PA-C 03/22/23 1945    Coral Spikes, DO 03/23/23 Rich Fuchs

## 2023-03-22 NOTE — ED Triage Notes (Signed)
Pt presents to triage after slipping on spilled milk and falling at [employer] approximately 90 minutes ago. Pt reports lower back pain at this time.

## 2023-03-22 NOTE — Discharge Instructions (Signed)
It was a pleasure taking care of you today!   Your x-ray of your knee was negative for fracture or dislocation at this time.  The CT scan of your back did not show any concerning emergent findings at this time. If your back pain continues over the course of the next month, please follow up with your primary care provider or the ED for additional imaging. You are prescribed Robaxin and lidoderm patches.  Take medications as prescribed.  Do not operate any heavy machinery or drive while taking Robaxin as it can make you sleepy/drowsy.  You will also be sent a prescription for lidoderm patch, remove and replace with a new patch every 12 hours. You may apply heat to the affected area for up to 15 minutes at a time.  Ensure to place a barrier between your skin and the heat. Return to the Emergency Department if you are experiencing loss of bowel or bladder, increasing/worsening symptoms, fever, inability to walk.

## 2023-03-22 NOTE — ED Provider Notes (Signed)
[place] EMERGENCY DEPARTMENT AT [facility] Provider Note   CSN: [id number] Arrival date & time: 03/22/23  1658     History  Chief Complaint  Patient presents with   Back Pain    Taylor Reeves is a 27 y.o. female who presents emergency department concerns for lower back pain onset prior to arrival.  Notes that she was in a [employer] when she slipped and fell and landed on her right knee and in her back.  No meds tried prior to arrival.  Denies hitting her head, LOC, neck pain, bowel/bladder incontinence, numbness, tingling, nausea, vomiting.  The history is provided by the patient. No language interpreter was used.       Home Medications Prior to Admission medications   Medication Sig Start Date End Date Taking? Authorizing Provider  albuterol (VENTOLIN HFA) 108 (90 Base) MCG/ACT inhaler Inhale 2 puffs into the lungs every 6 (six) hours as needed for wheezing or shortness of breath. 02/26/23   Waldon Merl, PA-C  benzonatate (TESSALON) 100 MG capsule Take 1 capsule (100 mg total) by mouth 3 (three) times daily as needed for cough. 02/26/23   Waldon Merl, PA-C  cyclobenzaprine (FLEXERIL) 10 MG tablet Take 0.5-1 tablets (5-10 mg total) by mouth 3 (three) times daily as needed. 10/08/22   Margaretann Loveless, PA-C  fluticasone (FLONASE) 50 MCG/ACT nasal spray Place 2 sprays into both nostrils daily. 02/26/23   Waldon Merl, PA-C  loratadine (CLARITIN) 10 MG tablet Take 1 tablet (10 mg total) by mouth daily. 12/30/22   Claiborne Rigg, NP  naproxen (NAPROSYN) 500 MG tablet Take 1 tablet (500 mg total) by mouth 2 (two) times daily with a meal. 10/08/22   Burnette, Alessandra Bevels, PA-C  olopatadine (PATADAY) 0.1 % ophthalmic solution Place 1 drop into both eyes 2 (two) times daily. 12/30/22   Claiborne Rigg, NP  ondansetron (ZOFRAN) 4 MG tablet Take 1 tablet (4 mg total) by mouth every 8 (eight) hours as needed for nausea or vomiting. 01/13/23   Ward, Tylene Fantasia, PA-C   pantoprazole (PROTONIX) 20 MG tablet Take 1 tablet (20 mg total) by mouth daily. 06/05/16 03/01/20  Audry Pili, PA-C      Allergies    Patient has no known allergies.    Review of Systems   Review of Systems  Musculoskeletal:  Positive for back pain.  All other systems reviewed and are negative.   Physical Exam Updated Vital Signs BP 121/77 (BP Location: Right Arm)   Pulse 94   Temp 98.2 F (36.8 C) (Oral)   Resp 16   Ht 4\' 11"  (1.499 m)   Wt 97.5 kg   LMP 03/15/2023 (Approximate)   SpO2 98%   BMI 43.42 kg/m  Physical Exam Vitals and nursing note reviewed.  Constitutional:      General: She is not in acute distress.    Appearance: Normal appearance.  Eyes:     General: No scleral icterus.    Extraocular Movements: Extraocular movements intact.  Cardiovascular:     Rate and Rhythm: Normal rate.  Pulmonary:     Effort: Pulmonary effort is normal. No respiratory distress.  Abdominal:     Palpations: Abdomen is soft. There is no mass.     Tenderness: There is no abdominal tenderness.  Musculoskeletal:        General: Normal range of motion.     Cervical back: Neck supple.     Comments: Mild diffuse tenderness to palpation  noted to right knee without appreciable swelling noted.  Abrasion noted to inferior aspect of right knee.  Able to flex and extend right knee against resistance.  Able to ambulate without assistance or difficulty.  Mild lumbar spinal and musculature tenderness to palpation.  No tenderness to palpation noted to C, T spine.  Skin:    General: Skin is warm and dry.     Findings: No rash.  Neurological:     Mental Status: She is alert.     Sensory: Sensation is intact.     Motor: Motor function is intact.  Psychiatric:        Behavior: Behavior normal.     ED Results / Procedures / Treatments   Labs (all labs ordered are listed, but only abnormal results are displayed) Labs Reviewed  PREGNANCY, URINE    EKG None  Radiology No results  found.  Procedures Procedures    Medications Ordered in ED Medications  lidocaine (LIDODERM) 5 % 1 patch (1 patch Transdermal Patch Applied 03/22/23 1803)  dexamethasone (DECADRON) injection 10 mg (10 mg Intramuscular Given 03/22/23 1801)  ketorolac (TORADOL) 15 MG/ML injection 15 mg (15 mg Intramuscular Given 03/22/23 1801)    ED Course/ Medical Decision Making/ A&P                                 Medical Decision Making Amount and/or Complexity of Data Reviewed Labs: ordered. Radiology: ordered.  Risk Prescription drug management.   Patient with lumbar back pain with without radiation status post recent fall. Patient is ambulatory without assistance or difficulty.  No loss of bowel or bladder control.  No concern for cauda equina. Vital signs, pt afebrile. On exam, patient with Mild diffuse tenderness to palpation noted to right knee without appreciable swelling noted.  Abrasion noted to inferior aspect of right knee.  Able to flex and extend right knee against resistance.  Able to ambulate without assistance or difficulty.  Mild lumbar spinal and musculature tenderness to palpation.  No tenderness to palpation noted to C, T spine. No concerning findings on exams. Differential diagnosis includes but is not limited to fracture, herniation, muscle strain, cauda equina, pancreatitis, appendicitis, pyelonephritis, nephrolithiasis.    Labs:  I ordered, and personally interpreted labs.  The pertinent results include:   Urine pregnancy negative  Imaging: I ordered imaging studies including right knee x-ray, CT lumbar spine ordered with results pending at time of sign out   Medications:  I ordered medication including toradol, Decadron, warm compress, lidoderm patch for pain management I have reviewed the patients home medicines and have made adjustments as needed   Patient case discussed with Lisette Abu, PA-C at sign-out. Plan at sign-out is pending imaging studies. Disposition as  per oncoming team. However, plans may change as per oncoming team. Patient care transferred at sign out.    This chart was dictated using voice recognition software, Dragon. Despite the best efforts of this provider to proofread and correct errors, errors may still occur which can change documentation meaning.  Final Clinical Impression(s) / ED Diagnoses Final diagnoses:  Lumbar strain, initial encounter  Acute pain of right knee    Rx / DC Orders ED Discharge Orders          Ordered    methocarbamol (ROBAXIN) 500 MG tablet  2 times daily        Pending    lidocaine (LIDODERM) 5 %  Every  24 hours        Pending              Jean Skow A, PA-C 03/22/23 1839    Coral Spikes, DO 03/23/23 0014

## 2023-04-02 ENCOUNTER — Telehealth: Payer: Self-pay | Admitting: Emergency Medicine

## 2023-04-02 DIAGNOSIS — M549 Dorsalgia, unspecified: Secondary | ICD-10-CM

## 2023-04-02 NOTE — Progress Notes (Signed)
Because you are having such severe pain, I feel your condition warrants further evaluation and I recommend that you be seen in a face to face visit.   NOTE: There will be NO CHARGE for this eVisit   If you are having a true medical emergency please call 911.      For an urgent face to face visit, [place] has eight urgent care centers for your convenience:   NEW!! [facility] Health Urgent Care Center at [facility] Get Driving Directions [phone] [address] [place], [address]    [facility] Health Urgent Care Center at [facility] Get Driving Directions [phone] [address] [place], [place] [address]   [facility] Health Ur[facility] Care Center [facility]) Get Driving Directions [phone] [address] [place], [place] [address]  [facility] Health Urgent Care Center [facility]) Get Driving Directions [phone] [address] [place],  [place]  [address]  [facility] Health Urgent Care Center [facility] - at [employer]  [phone] [address] [place],  [place] [address]   [facility] Health Urgent Care at [facility] Get Driving Directions [phone] [address] [place], [place] [address]   [facility] Health Urgent Care at [facility] Get Driving Directions  [phone] [address] [place], [place] [address]   [facility] Health Urgent Care at [facility] Directions [phone] [address] [place], [place] [address]  Your MyChart E-visit questionnaire answers were reviewed by a board certified advanced clinical practitioner to complete your personal care plan based on your specific symptoms.  Thank you for using e-Visits.

## 2023-04-03 ENCOUNTER — Encounter (HOSPITAL_COMMUNITY): Payer: Self-pay

## 2023-04-03 ENCOUNTER — Ambulatory Visit (HOSPITAL_COMMUNITY)
Admission: RE | Admit: 2023-04-03 | Discharge: 2023-04-03 | Disposition: A | Discharge status: Home / Self Care | Payer: Self-pay | Source: Ambulatory Visit | Attending: Nurse Practitioner | Admitting: Nurse Practitioner

## 2023-04-03 VITALS — BP 111/81 | HR 97 | Temp 98.7°F | Resp 16 | Ht 59.0 in | Wt 218.0 lb

## 2023-04-03 DIAGNOSIS — S39012D Strain of muscle, fascia and tendon of lower back, subsequent encounter: Secondary | ICD-10-CM

## 2023-04-03 MED ORDER — IBUPROFEN 800 MG PO TABS: 800.0000 mg | ORAL_TABLET | Freq: Three times a day (TID) | ORAL | 0 refills | Status: AC | PRN

## 2023-04-03 NOTE — ED Triage Notes (Signed)
Patient here today with c/o LB pain after falling in the grocery store. Patient states that she fell on her right knee. Increased pain with standing and walking. Going from sitting to standing and getting out of bed. She has been having difficulty sleeping at night due to the pain. She has eben taking Tylenol with some relief.

## 2023-04-03 NOTE — ED Provider Notes (Signed)
[facility]    CSN: [id number] Arrival date & time: 04/03/23  1004      History   Chief Complaint Chief Complaint  Patient presents with   Back Pain    I fell in grocery store 2 Fridays ago when I fell I came down on my knee but had instant lower back pain even pain shoots at the top sometimes just wanted to be seen again have trouble sleeping , walking and standing pain as well - Entered by patient    HPI Taylor Reeves is a 27 y.o. female.   Patient presents for back pain that started suddenly a few weeks ago.  Reports she fell on her right knee at a grocery store and suddenly began having sharp back pain at that time.  Was initially seen in the ER and had xray imaging of lumbar spine which was negative.  She never picked up the muscle relaxant that was prescribed by the ER provider, has been taking Tylenol for pain which helps slightly.  She could not sleep last night due to pain so she took a friend's alprazolam which helped her fall asleep.  Reports pain is severe and sharp in low back.  No radiating pain down either leg.    Denies numbness or tingling, saddle anesthesia, bowel/bladder incontinence, fevers, nausea/vomiting, and dysuria/urinary frequency.  No weakness of lower extremities, inability to bear weight on lower extremities, or decreased sensation of lower extremities.     History reviewed. No pertinent past medical history.  There are no problems to display for this patient.   History reviewed. No pertinent surgical history.  OB History   No obstetric history on file.      Home Medications    Prior to Admission medications   Medication Sig Start Date End Date Taking? Authorizing Provider  ibuprofen (ADVIL) 800 MG tablet Take 1 tablet (800 mg total) by mouth every 8 (eight) hours as needed. Take with food to prevent GI upset 04/03/23  Yes Cathlean Marseilles A, NP  albuterol (VENTOLIN HFA) 108 (90 Base) MCG/ACT inhaler Inhale 2 puffs into the lungs  every 6 (six) hours as needed for wheezing or shortness of breath. 02/26/23   Waldon Merl, PA-C  benzonatate (TESSALON) 100 MG capsule Take 1 capsule (100 mg total) by mouth 3 (three) times daily as needed for cough. 02/26/23   Waldon Merl, PA-C  cyclobenzaprine (FLEXERIL) 10 MG tablet Take 0.5-1 tablets (5-10 mg total) by mouth 3 (three) times daily as needed. 10/08/22   Margaretann Loveless, PA-C  fluticasone (FLONASE) 50 MCG/ACT nasal spray Place 2 sprays into both nostrils daily. 02/26/23   Waldon Merl, PA-C  lidocaine (LIDODERM) 5 % Place 1 patch onto the skin daily. Remove & Discard patch within 12 hours or as directed by MD 03/22/23   Smitty Knudsen, PA-C  loratadine (CLARITIN) 10 MG tablet Take 1 tablet (10 mg total) by mouth daily. 12/30/22   Claiborne Rigg, NP  methocarbamol (ROBAXIN) 500 MG tablet Take 1 tablet (500 mg total) by mouth 2 (two) times daily. 03/22/23   Smitty Knudsen, PA-C  olopatadine (PATADAY) 0.1 % ophthalmic solution Place 1 drop into both eyes 2 (two) times daily. 12/30/22   Claiborne Rigg, NP  ondansetron (ZOFRAN) 4 MG tablet Take 1 tablet (4 mg total) by mouth every 8 (eight) hours as needed for nausea or vomiting. 01/13/23   Ward, Tylene Fantasia, PA-C  pantoprazole (PROTONIX) 20 MG tablet Take 1  tablet (20 mg total) by mouth daily. 06/05/16 03/01/20  Audry Pili, PA-C    Family History Family History  Family history unknown: Yes    Social History Social History   Tobacco Use   Smoking status: Some Days    Types: Cigars   Smokeless tobacco: Never  Vaping Use   Vaping status: Former  Substance Use Topics   Alcohol use: Yes    Comment: occasionally   Drug use: Yes    Types: Marijuana     Allergies   Patient has no known allergies.   Review of Systems Review of Systems Per HPI  Physical Exam Triage Vital Signs ED Triage Vitals  Encounter Vitals Group     BP 04/03/23 1040 111/81     Systolic BP Percentile --      Diastolic BP  Percentile --      Pulse Rate 04/03/23 1040 97     Resp 04/03/23 1040 16     Temp 04/03/23 1040 98.7 F (37.1 C)     Temp Source 04/03/23 1040 Oral     SpO2 04/03/23 1040 97 %     Weight 04/03/23 1040 218 lb (98.9 kg)     Height 04/03/23 1040 4\' 11"  (1.499 m)     Head Circumference --      Peak Flow --      Pain Score 04/03/23 1039 8     Pain Loc --      Pain Education --      Exclude from Growth Chart --    No data found.  Updated Vital Signs BP 111/81 (BP Location: Left Arm)   Pulse 97   Temp 98.7 F (37.1 C) (Oral)   Resp 16   Ht 4\' 11"  (1.499 m)   Wt 218 lb (98.9 kg)   LMP 03/15/2023 (Approximate)   SpO2 97%   BMI 44.03 kg/m   Visual Acuity Right Eye Distance:   Left Eye Distance:   Bilateral Distance:    Right Eye Near:   Left Eye Near:    Bilateral Near:     Physical Exam Vitals and nursing note reviewed.  Constitutional:      General: She is not in acute distress.    Appearance: Normal appearance. She is not toxic-appearing.  HENT:     Mouth/Throat:     Mouth: Mucous membranes are moist.     Pharynx: Oropharynx is clear.  Pulmonary:     Effort: Pulmonary effort is normal. No respiratory distress.  Musculoskeletal:       Back:     Comments: Inspection: no swelling, bruising, obvious deformity or redness to lumbar spine Palpation: tender to palpation low spine diffusely; no obvious deformities palpated ROM: Full ROM to bilateral lower extremities Strength: 5/5 bilateral lower extremities Neurovascular: neurovascularly intact in bilateral lower extremities   Skin:    General: Skin is warm and dry.     Capillary Refill: Capillary refill takes less than 2 seconds.     Coloration: Skin is not jaundiced or pale.     Findings: No erythema.  Neurological:     Mental Status: She is alert and oriented to person, place, and time.  Psychiatric:        Behavior: Behavior is cooperative.      UC Treatments / Results  Labs (all labs ordered are listed,  but only abnormal results are displayed) Labs Reviewed - No data to display  EKG   Radiology No results found.  Procedures Procedures (including  critical care time)  Medications Ordered in UC Medications - No data to display  Initial Impression / Assessment and Plan / UC Course  I have reviewed the triage vital signs and the nursing notes.  Pertinent labs & imaging results that were available during my care of the patient were reviewed by me and considered in my medical decision making (see chart for details).   Patient is well-appearing, normotensive, afebrile, not tachycardic, not tachypneic, oxygenating well on room air.    1. Strain of lumbar region, subsequent encounter Vitals and exam today reassuring Patient has not taken muscle relaxant yet, recommended taking and continue Tylenol/ibuprofen, ibuprofen sent to pharmacy Back exercises given, follow-up with sports medicine with improvement symptoms despite treatment Work excuse given  The patient was given the opportunity to ask questions.  All questions answered to their satisfaction.  The patient is in agreement to this plan.    Final Clinical Impressions(s) / UC Diagnoses   Final diagnoses:  Strain of lumbar region, subsequent encounter     Discharge Instructions      Please pick up the muscle relaxant and take as prescribed to help with your back pain.  Continue Tylenol and alternate with ibuprofen 800 mg every 8 hours as needed.  Start back exercises/stretches.  Follow up with Sports Medicine if symptoms persist.    ED Prescriptions     Medication Sig Dispense Auth. Provider   ibuprofen (ADVIL) 800 MG tablet Take 1 tablet (800 mg total) by mouth every 8 (eight) hours as needed. Take with food to prevent GI upset 30 tablet Valentino Nose, NP      PDMP not reviewed this encounter.   Valentino Nose, NP 04/03/23 1242

## 2023-04-03 NOTE — Discharge Instructions (Signed)
Please pick up the muscle relaxant and take as prescribed to help with your back pain.  Continue Tylenol and alternate with ibuprofen 800 mg every 8 hours as needed.  Start back exercises/stretches.  Follow up with Sports Medicine if symptoms persist.

## 2023-11-12 DIAGNOSIS — F1220 Cannabis dependence, uncomplicated: Secondary | ICD-10-CM | POA: Diagnosis not present

## 2023-11-13 DIAGNOSIS — F1220 Cannabis dependence, uncomplicated: Secondary | ICD-10-CM | POA: Diagnosis not present

## 2023-11-20 DIAGNOSIS — F1220 Cannabis dependence, uncomplicated: Secondary | ICD-10-CM | POA: Diagnosis not present

## 2023-11-27 DIAGNOSIS — F4323 Adjustment disorder with mixed anxiety and depressed mood: Secondary | ICD-10-CM | POA: Diagnosis not present

## 2023-12-01 DIAGNOSIS — F4323 Adjustment disorder with mixed anxiety and depressed mood: Secondary | ICD-10-CM | POA: Diagnosis not present

## 2023-12-04 DIAGNOSIS — F4323 Adjustment disorder with mixed anxiety and depressed mood: Secondary | ICD-10-CM | POA: Diagnosis not present

## 2023-12-11 DIAGNOSIS — F4323 Adjustment disorder with mixed anxiety and depressed mood: Secondary | ICD-10-CM | POA: Diagnosis not present

## 2023-12-14 DIAGNOSIS — F4323 Adjustment disorder with mixed anxiety and depressed mood: Secondary | ICD-10-CM | POA: Diagnosis not present

## 2023-12-15 DIAGNOSIS — F4323 Adjustment disorder with mixed anxiety and depressed mood: Secondary | ICD-10-CM | POA: Diagnosis not present

## 2023-12-17 DIAGNOSIS — F4323 Adjustment disorder with mixed anxiety and depressed mood: Secondary | ICD-10-CM | POA: Diagnosis not present

## 2023-12-22 DIAGNOSIS — F4323 Adjustment disorder with mixed anxiety and depressed mood: Secondary | ICD-10-CM | POA: Diagnosis not present

## 2023-12-24 DIAGNOSIS — F4323 Adjustment disorder with mixed anxiety and depressed mood: Secondary | ICD-10-CM | POA: Diagnosis not present

## 2023-12-29 DIAGNOSIS — F4323 Adjustment disorder with mixed anxiety and depressed mood: Secondary | ICD-10-CM | POA: Diagnosis not present

## 2024-01-05 DIAGNOSIS — F4323 Adjustment disorder with mixed anxiety and depressed mood: Secondary | ICD-10-CM | POA: Diagnosis not present

## 2024-01-12 DIAGNOSIS — F4323 Adjustment disorder with mixed anxiety and depressed mood: Secondary | ICD-10-CM | POA: Diagnosis not present

## 2024-01-13 DIAGNOSIS — F4323 Adjustment disorder with mixed anxiety and depressed mood: Secondary | ICD-10-CM | POA: Diagnosis not present

## 2024-01-19 DIAGNOSIS — F4323 Adjustment disorder with mixed anxiety and depressed mood: Secondary | ICD-10-CM | POA: Diagnosis not present

## 2024-01-26 DIAGNOSIS — F4323 Adjustment disorder with mixed anxiety and depressed mood: Secondary | ICD-10-CM | POA: Diagnosis not present

## 2024-02-02 DIAGNOSIS — F4323 Adjustment disorder with mixed anxiety and depressed mood: Secondary | ICD-10-CM | POA: Diagnosis not present

## 2024-02-09 DIAGNOSIS — F4323 Adjustment disorder with mixed anxiety and depressed mood: Secondary | ICD-10-CM | POA: Diagnosis not present

## 2024-02-16 DIAGNOSIS — F4323 Adjustment disorder with mixed anxiety and depressed mood: Secondary | ICD-10-CM | POA: Diagnosis not present

## 2024-02-24 DIAGNOSIS — F4323 Adjustment disorder with mixed anxiety and depressed mood: Secondary | ICD-10-CM | POA: Diagnosis not present

## 2024-03-01 DIAGNOSIS — F4323 Adjustment disorder with mixed anxiety and depressed mood: Secondary | ICD-10-CM | POA: Diagnosis not present

## 2024-03-08 DIAGNOSIS — F4323 Adjustment disorder with mixed anxiety and depressed mood: Secondary | ICD-10-CM | POA: Diagnosis not present

## 2024-03-15 DIAGNOSIS — F4323 Adjustment disorder with mixed anxiety and depressed mood: Secondary | ICD-10-CM | POA: Diagnosis not present

## 2024-03-22 DIAGNOSIS — F4323 Adjustment disorder with mixed anxiety and depressed mood: Secondary | ICD-10-CM | POA: Diagnosis not present

## 2024-03-23 DIAGNOSIS — F4323 Adjustment disorder with mixed anxiety and depressed mood: Secondary | ICD-10-CM | POA: Diagnosis not present

## 2024-03-31 DIAGNOSIS — F4323 Adjustment disorder with mixed anxiety and depressed mood: Secondary | ICD-10-CM | POA: Diagnosis not present

## 2024-04-03 DIAGNOSIS — F4323 Adjustment disorder with mixed anxiety and depressed mood: Secondary | ICD-10-CM | POA: Diagnosis not present

## 2024-04-05 DIAGNOSIS — F4323 Adjustment disorder with mixed anxiety and depressed mood: Secondary | ICD-10-CM | POA: Diagnosis not present

## 2024-04-06 DIAGNOSIS — F4323 Adjustment disorder with mixed anxiety and depressed mood: Secondary | ICD-10-CM | POA: Diagnosis not present

## 2024-04-08 DIAGNOSIS — F4323 Adjustment disorder with mixed anxiety and depressed mood: Secondary | ICD-10-CM | POA: Diagnosis not present

## 2024-04-20 ENCOUNTER — Telehealth: Admitting: Physician Assistant

## 2024-04-20 ENCOUNTER — Encounter

## 2024-04-20 DIAGNOSIS — F4323 Adjustment disorder with mixed anxiety and depressed mood: Secondary | ICD-10-CM | POA: Diagnosis not present

## 2024-04-20 DIAGNOSIS — A084 Viral intestinal infection, unspecified: Secondary | ICD-10-CM | POA: Diagnosis not present

## 2024-04-20 MED ORDER — ONDANSETRON 4 MG PO TBDP: 4.0000 mg | ORAL_TABLET | Freq: Three times a day (TID) | ORAL | 0 refills | Status: AC | PRN

## 2024-04-20 NOTE — Progress Notes (Signed)
 E-Visit for Nausea and Vomiting   We are sorry that you are not feeling well. Here is how we plan to help!  Based on what you have shared with me it looks like you have a Virus that is irritating your GI tract.  Vomiting is the forceful emptying of a portion of the stomach's content through the mouth.  Although nausea and vomiting can make you feel miserable, it's important to remember that these are not diseases, but rather symptoms of an underlying illness.  When we treat short term symptoms, we always caution that any symptoms that persist should be fully evaluated in a medical office.  I have prescribed a medication that will help alleviate your symptoms and allow you to stay hydrated:  Zofran 4 mg 1 tablet every 8 hours as needed for nausea and vomiting  For your symptoms of diarrhea you may take Imodium 2 mg tablets that are over the counter at your local pharmacy. Take two tablet now and then one after each loose stool up to 6 a day.    HOME CARE: Drink clear liquids.  This is very important! Dehydration (the lack of fluid) can lead to a serious complication.  Start off with 1 tablespoon every 5 minutes for 8 hours. You may begin eating bland foods after 8 hours without vomiting.  Start with saltine crackers, white bread, rice, mashed potatoes, applesauce. After 48 hours on a bland diet, you may resume a normal diet. Try to go to sleep.  Sleep often empties the stomach and relieves the need to vomit.  GET HELP RIGHT AWAY IF:  Your symptoms do not improve or worsen within 2 days after treatment. You have a fever for over 3 days. You cannot keep down fluids after trying the medication.  MAKE SURE YOU:  Understand these instructions. Will watch your condition. Will get help right away if you are not doing well or get worse.    Thank you for choosing an e-visit.  Your e-visit answers were reviewed by a board certified advanced clinical practitioner to complete your personal care  plan. Depending upon the condition, your plan could have included both over the counter or prescription medications.  Please review your pharmacy choice. Make sure the pharmacy is open so you can pick up prescription now. If there is a problem, you may contact your provider through [employer] and have the prescription routed to another pharmacy.  Your safety is important to [place]. If you have drug allergies check your prescription carefully.   For the next 24 hours you can use MyChart to ask questions about today's visit, request a non-urgent call back, or ask for a work or school excuse. You will get an email in the next two days asking about your experience. I hope that your e-visit has been valuable and will speed your recovery.  I have spent 5 minutes in review of e-visit questionnaire, review and updating patient chart, medical decision making and response to patient.   Margaretann Loveless, PA-C

## 2024-04-20 NOTE — Progress Notes (Signed)
 Based on what you shared with me, I feel your condition warrants further evaluation as soon as possible at an Emergency department. You answered yes to having the emesis have a red, maroon, or coffee ground appearance. This could indicate vomiting blood and is considered a medical emergency to find the source of bleeding.    NOTE: There will be NO CHARGE for this eVisit   If you are having a true medical emergency please call 911.      Emergency Department-[facility]  Get Driving Directions  [phone]  [address]  [place], [place] [address]  Open 24/7/365      [facility] Emergency Department at [facility]  Get Driving Directions  [address]  [place], [place] [address]  Open 24/7/365    Emergency Department- [facility]  Get Driving Directions  [phone]  [address]  [place], [place] [address]  Open 24/7/365      [facility] at [facility]  Get Driving Directions  [phone]  [address]  [place], [place] [address]  Open 24/7/365    [facility] Department- [facility]  Get Driving Directions  [phone]  [address]  [place], [place] [address]  Open 24/7/365    [place]  Emergency Department- [facility]  Get Driving Directions  [address]  [place], [place] [address]  Open 24/7/365    [facility]  Emergency Department- [facility]  Get Driving Directions  [phone]  [address]  [place], [place] [address]  Open 24/7/365      I have spent 5 minutes in review of e-visit questionnaire, review and updating patient chart, medical decision making and response to patient.   Delon CHRISTELLA Dickinson, PA-C

## 2024-04-20 NOTE — Addendum Note (Signed)
 Addended by: VIVIENNE DELON HERO on: 04/20/2024 08:31 AM   Modules accepted: Orders, Level of Service

## 2024-04-21 DIAGNOSIS — F4323 Adjustment disorder with mixed anxiety and depressed mood: Secondary | ICD-10-CM | POA: Diagnosis not present

## 2024-04-26 DIAGNOSIS — F4323 Adjustment disorder with mixed anxiety and depressed mood: Secondary | ICD-10-CM | POA: Diagnosis not present

## 2024-08-17 ENCOUNTER — Telehealth: Admitting: Nurse Practitioner

## 2024-08-17 DIAGNOSIS — R6889 Other general symptoms and signs: Secondary | ICD-10-CM | POA: Diagnosis not present

## 2024-08-17 DIAGNOSIS — R051 Acute cough: Secondary | ICD-10-CM

## 2024-08-18 ENCOUNTER — Encounter: Payer: Self-pay | Admitting: Family Medicine

## 2024-08-18 MED ORDER — BENZONATATE 100 MG PO CAPS: 100.0000 mg | ORAL_CAPSULE | Freq: Three times a day (TID) | ORAL | 0 refills | Status: AC | PRN

## 2024-08-18 MED ORDER — PROMETHAZINE-DM 6.25-15 MG/5ML PO SYRP: 5.0000 mL | ORAL_SOLUTION | Freq: Four times a day (QID) | ORAL | 0 refills | Status: AC | PRN

## 2024-08-18 MED ORDER — FLUTICASONE PROPIONATE 50 MCG/ACT NA SUSP: 2.0000 | Freq: Every day | NASAL | 0 refills | Status: AC

## 2024-08-18 NOTE — Progress Notes (Signed)
 E visit for Flu like symptoms   We are sorry that you are not feeling well.  Here is how we plan to help! Based on what you have shared with me it looks like you may have possible exposure to a virus that causes influenza. You are outside the window for Tamiflu to be started.  Influenza or the flu is  an infection caused by a respiratory virus. The flu virus is highly contagious and persons who did not receive their yearly flu vaccination may catch the flu from close contact.  For nasal congestion, you may use an oral decongestant such as Mucinex D or if you have glaucoma or high blood pressure use plain Mucinex.  Saline nasal spray or nasal drops can help and can safely be used as often as needed for congestion.  If you have a sore or scratchy throat, use a saltwater gargle-  to  teaspoon of salt dissolved in a 4-ounce to 8-ounce glass of warm water.  Gargle the solution for approximately 15-30 seconds and then spit.  It is important not to swallow the solution.  You can also use throat lozenges/cough drops and Chloraseptic spray to help with throat pain or discomfort.  Warm or cold liquids can also be helpful in relieving throat pain.  For headache, pain or general discomfort, you can use Ibuprofen  or Tylenol  as directed.   Some authorities believe that zinc sprays or the use of Echinacea may shorten the course of your symptoms.  I have prescribed the following medications to help lessen symptoms: I have prescribed Tessalon  Perles 100 mg. You may take 1-2 capsules every 8 hours as needed for cough, I have prescribed Phenergan  DM 6.25 mg/15 mg. You make take one teaspoon / 5 ml every 4-6 hours as needed for cough, and I have prescribed Fluticasone  nasal spray 2 sprays in each nostril one time per dayasal spray 2 sprays in each nostril one time per day  You are to isolate at home until you have been fever-free for at least 24 hours without a fever-reducing medication, and symptoms have been  steadily improving for 24 hours.  If you must be around other household members who do not have symptoms, you need to make sure that both you and the family members are masking consistently with a high-quality mask.  If you note any worsening of symptoms despite treatment, please seek an in-person evaluation ASAP. If you note any significant shortness of breath or any chest pain, please seek ED evaluation. Please do not delay care!  ANYONE WHO HAS FLU SYMPTOMS SHOULD: Stay home. The flu is highly contagious and going out or to work exposes others! Be sure to drink plenty of fluids. Water is fine as well as fruit juices, sodas and electrolyte beverages. You may want to stay away from caffeine or alcohol. If you are nauseated, try taking small sips of liquids. How do you know if you are getting enough fluid? Your urine should be a pale yellow or almost colorless. Get rest. Taking a steamy shower or using a humidifier may help nasal congestion and ease sore throat pain. Using a saline nasal spray works much the same way. Cough drops, hard candies and sore throat lozenges may ease your cough. Line up a caregiver. Have someone check on you regularly.  GET HELP RIGHT AWAY IF: You cannot keep down liquids or your medications. You become short of breath Your fell like you are going to pass out or loose consciousness. Your symptoms  persist after you have completed your treatment plan  MAKE SURE YOU  Understand these instructions. Will watch your condition. Will get help right away if you are not doing well or get worse.  Your e-visit answers were reviewed by a board certified advanced clinical practitioner to complete your personal care plan.  Depending on the condition, your plan could have included both over the counter or prescription medications.  If there is a problem please reply  once you have received a response from your provider.  Your safety is important to us .  If you have drug  allergies check your prescription carefully.    You can use MyChart to ask questions about todays visit, request a non-urgent call back, or ask for a work or school excuse for 24 hours related to this e-Visit. If it has been greater than 24 hours you will need to follow up with your provider, or enter a new e-Visit to address those concerns.  You will get an e-mail in the next two days asking about your experience.  I hope that your e-visit has been valuable and will speed your recovery. Thank you for using e-visits.   I have spent 5 minutes in review of e-visit questionnaire, review and updating patient chart, medical decision making and response to patient.   Chiquita CHRISTELLA Barefoot, NP

## 2024-08-20 ENCOUNTER — Telehealth: Admitting: Nurse Practitioner

## 2024-08-20 DIAGNOSIS — J40 Bronchitis, not specified as acute or chronic: Secondary | ICD-10-CM

## 2024-08-21 MED ORDER — AZITHROMYCIN 250 MG PO TABS: ORAL_TABLET | ORAL | 0 refills | Status: AC

## 2024-08-21 NOTE — Progress Notes (Signed)
 Message sent to patient requesting further input regarding current symptoms. Awaiting patient response.

## 2024-08-21 NOTE — Progress Notes (Signed)
 We are sorry that you are not feeling well.  Here is how we plan to help!  Based on your presentation I believe you most likely have A cough due to bacteria.  When patients have a fever and a productive cough with a change in color or increased sputum production, we are concerned about bacterial bronchitis.  If left untreated it can progress to pneumonia.  If your symptoms do not improve with your treatment plan it is important that you contact your provider.   I have prescribed Azithromyin 250 mg: two tablets now and then one tablet daily for 4 additonal days    In addition you may use A non-prescription cough medication called Mucinex DM: take 2 tablets every 12 hours.    From your responses in the eVisit questionnaire you describe inflammation in the upper respiratory tract which is causing a significant cough.  This is commonly called Bronchitis and has four common causes:   Allergies Viral Infections Acid Reflux Bacterial Infection Allergies, viruses and acid reflux are treated by controlling symptoms or eliminating the cause. An example might be a cough caused by taking certain blood pressure medications. You stop the cough by changing the medication. Another example might be a cough caused by acid reflux. Controlling the reflux helps control the cough.  USE OF BRONCHODILATOR (RESCUE) INHALERS: There is a risk from using your bronchodilator too frequently.  The risk is that over-reliance on a medication which only relaxes the muscles surrounding the breathing tubes can reduce the effectiveness of medications prescribed to reduce swelling and congestion of the tubes themselves.  Although you feel brief relief from the bronchodilator inhaler, your asthma may actually be worsening with the tubes becoming more swollen and filled with mucus.  This can delay other crucial treatments, such as oral steroid medications. If you need to use a bronchodilator inhaler daily, several times per day, you  should discuss this with your provider.  There are probably better treatments that could be used to keep your asthma under control.     HOME CARE Only take medications as instructed by your medical team. Complete the entire course of an antibiotic. Drink plenty of fluids and get plenty of rest. Avoid close contacts especially the very young and the elderly Cover your mouth if you cough or cough into your sleeve. Always remember to wash your hands A steam or ultrasonic humidifier can help congestion.   GET HELP RIGHT AWAY IF: You develop worsening fever. You become short of breath You cough up blood. Your symptoms persist after you have completed your treatment plan MAKE SURE YOU  Understand these instructions. Will watch your condition. Will get help right away if you are not doing well or get worse.  Your e-visit answers were reviewed by a board certified advanced clinical practitioner to complete your personal care plan.  Depending on the condition, your plan could have included both over the counter or prescription medications. If there is a problem please reply  once you have received a response from your provider. Your safety is important to us .  If you have drug allergies check your prescription carefully.    You can use MyChart to ask questions about todays visit, request a non-urgent call back, or ask for a work or school excuse for 24 hours related to this e-Visit. If it has been greater than 24 hours you will need to follow up with your provider, or enter a new e-Visit to address those concerns. You will get  an e-mail in the next two days asking about your experience.  I hope that your e-visit has been valuable and will speed your recovery. Thank you for using e-visits.   I have spent 5 minutes in review of e-visit questionnaire, review and updating patient chart, medical decision making and response to patient.   Lauraine Kitty, FNP
